# Patient Record
Sex: Female | Born: 1960 | Race: White | Hispanic: No | Marital: Married | Smoking: Current every day smoker
Health system: Southern US, Community
[De-identification: ages and names within clinical notes are randomized; demographics above are authoritative.]

## PROBLEM LIST (undated history)

## (undated) DIAGNOSIS — J45909 Unspecified asthma, uncomplicated: Secondary | ICD-10-CM

## (undated) DIAGNOSIS — E039 Hypothyroidism, unspecified: Secondary | ICD-10-CM

## (undated) DIAGNOSIS — C801 Malignant (primary) neoplasm, unspecified: Secondary | ICD-10-CM

## (undated) DIAGNOSIS — I10 Essential (primary) hypertension: Secondary | ICD-10-CM

---

## 1998-01-02 ENCOUNTER — Other Ambulatory Visit: Admission: RE | Admit: 1998-01-02 | Discharge: 1998-01-02 | Payer: Self-pay | Admitting: Unknown Physician Specialty

## 1998-09-13 ENCOUNTER — Ambulatory Visit: Admission: RE | Admit: 1998-09-13 | Discharge: 1998-09-13 | Payer: Self-pay | Admitting: Otolaryngology

## 1998-11-08 ENCOUNTER — Other Ambulatory Visit: Admission: RE | Admit: 1998-11-08 | Discharge: 1998-11-08 | Payer: Self-pay | Admitting: Otolaryngology

## 1998-11-08 ENCOUNTER — Encounter (INDEPENDENT_AMBULATORY_CARE_PROVIDER_SITE_OTHER): Payer: Self-pay | Admitting: Specialist

## 2001-01-03 ENCOUNTER — Encounter: Payer: Self-pay | Admitting: Emergency Medicine

## 2001-01-03 ENCOUNTER — Inpatient Hospital Stay (HOSPITAL_COMMUNITY): Admission: EM | Admit: 2001-01-03 | Discharge: 2001-01-05 | Payer: Self-pay | Admitting: Emergency Medicine

## 2001-06-23 ENCOUNTER — Other Ambulatory Visit: Admission: RE | Admit: 2001-06-23 | Discharge: 2001-06-23 | Payer: Self-pay | Admitting: Obstetrics and Gynecology

## 2002-05-23 ENCOUNTER — Ambulatory Visit: Admission: RE | Admit: 2002-05-23 | Discharge: 2002-05-23 | Payer: Self-pay | Admitting: Gynecologic Oncology

## 2002-05-23 ENCOUNTER — Other Ambulatory Visit: Admission: RE | Admit: 2002-05-23 | Discharge: 2002-05-23 | Payer: Self-pay | Admitting: Gynecologic Oncology

## 2002-05-23 ENCOUNTER — Encounter (INDEPENDENT_AMBULATORY_CARE_PROVIDER_SITE_OTHER): Payer: Self-pay | Admitting: *Deleted

## 2002-05-30 ENCOUNTER — Encounter: Payer: Self-pay | Admitting: Gynecology

## 2002-05-30 ENCOUNTER — Ambulatory Visit (HOSPITAL_COMMUNITY): Admission: RE | Admit: 2002-05-30 | Discharge: 2002-05-30 | Payer: Self-pay | Admitting: Gynecology

## 2002-06-21 ENCOUNTER — Ambulatory Visit: Admission: RE | Admit: 2002-06-21 | Discharge: 2002-06-21 | Payer: Self-pay | Admitting: Gynecologic Oncology

## 2003-07-02 ENCOUNTER — Other Ambulatory Visit: Admission: RE | Admit: 2003-07-02 | Discharge: 2003-07-02 | Payer: Self-pay | Admitting: Obstetrics and Gynecology

## 2004-03-26 ENCOUNTER — Encounter (INDEPENDENT_AMBULATORY_CARE_PROVIDER_SITE_OTHER): Payer: Self-pay | Admitting: *Deleted

## 2004-03-26 ENCOUNTER — Ambulatory Visit (HOSPITAL_COMMUNITY): Admission: RE | Admit: 2004-03-26 | Discharge: 2004-03-27 | Payer: Self-pay | Admitting: Otolaryngology

## 2004-04-25 ENCOUNTER — Inpatient Hospital Stay (HOSPITAL_COMMUNITY): Admission: RE | Admit: 2004-04-25 | Discharge: 2004-04-28 | Payer: Self-pay | Admitting: Otolaryngology

## 2004-04-25 ENCOUNTER — Encounter: Payer: Self-pay | Admitting: Endocrinology

## 2004-04-25 ENCOUNTER — Encounter (INDEPENDENT_AMBULATORY_CARE_PROVIDER_SITE_OTHER): Payer: Self-pay | Admitting: Specialist

## 2004-06-10 ENCOUNTER — Encounter (HOSPITAL_COMMUNITY): Admission: RE | Admit: 2004-06-10 | Discharge: 2004-09-08 | Payer: Self-pay | Admitting: Endocrinology

## 2004-08-08 ENCOUNTER — Other Ambulatory Visit: Admission: RE | Admit: 2004-08-08 | Discharge: 2004-08-08 | Payer: Self-pay | Admitting: Obstetrics and Gynecology

## 2005-04-13 ENCOUNTER — Encounter (HOSPITAL_COMMUNITY): Admission: RE | Admit: 2005-04-13 | Discharge: 2005-07-12 | Payer: Self-pay | Admitting: Endocrinology

## 2005-09-04 ENCOUNTER — Other Ambulatory Visit: Admission: RE | Admit: 2005-09-04 | Discharge: 2005-09-04 | Payer: Self-pay | Admitting: Obstetrics and Gynecology

## 2005-09-21 ENCOUNTER — Ambulatory Visit (HOSPITAL_COMMUNITY): Admission: RE | Admit: 2005-09-21 | Discharge: 2005-09-21 | Payer: Self-pay | Admitting: Obstetrics and Gynecology

## 2006-09-05 IMAGING — CR DG CHEST 2V
2 series · 2 of 2 positions shown · non-contrast
Comparison: none

CLINICAL DATA: Preoperative respiratory exam for left neck node. 
 CHEST ? TWO VIEW:

  The heart size and mediastinal contours are normal. The lungs are clear. The visualized skeleton is unremarkable.

[view not recorded (1 of 2)]
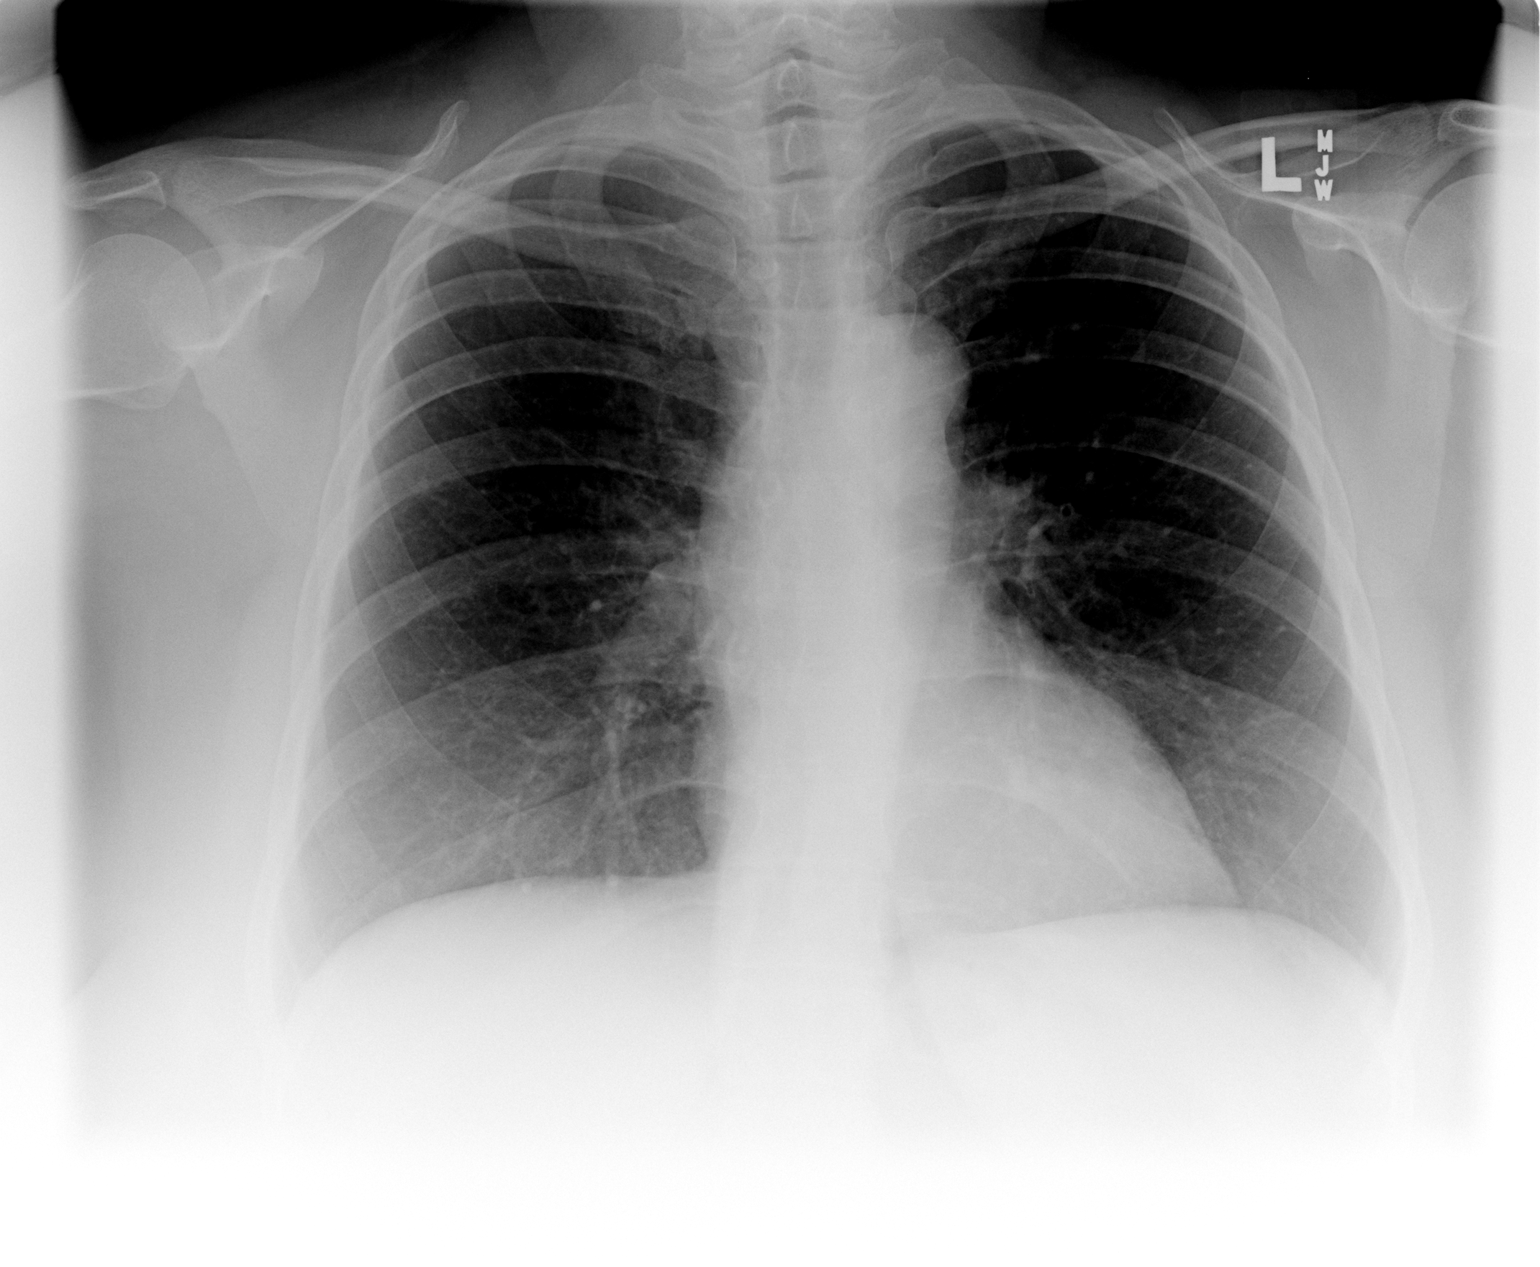

[view not recorded (2 of 2)]
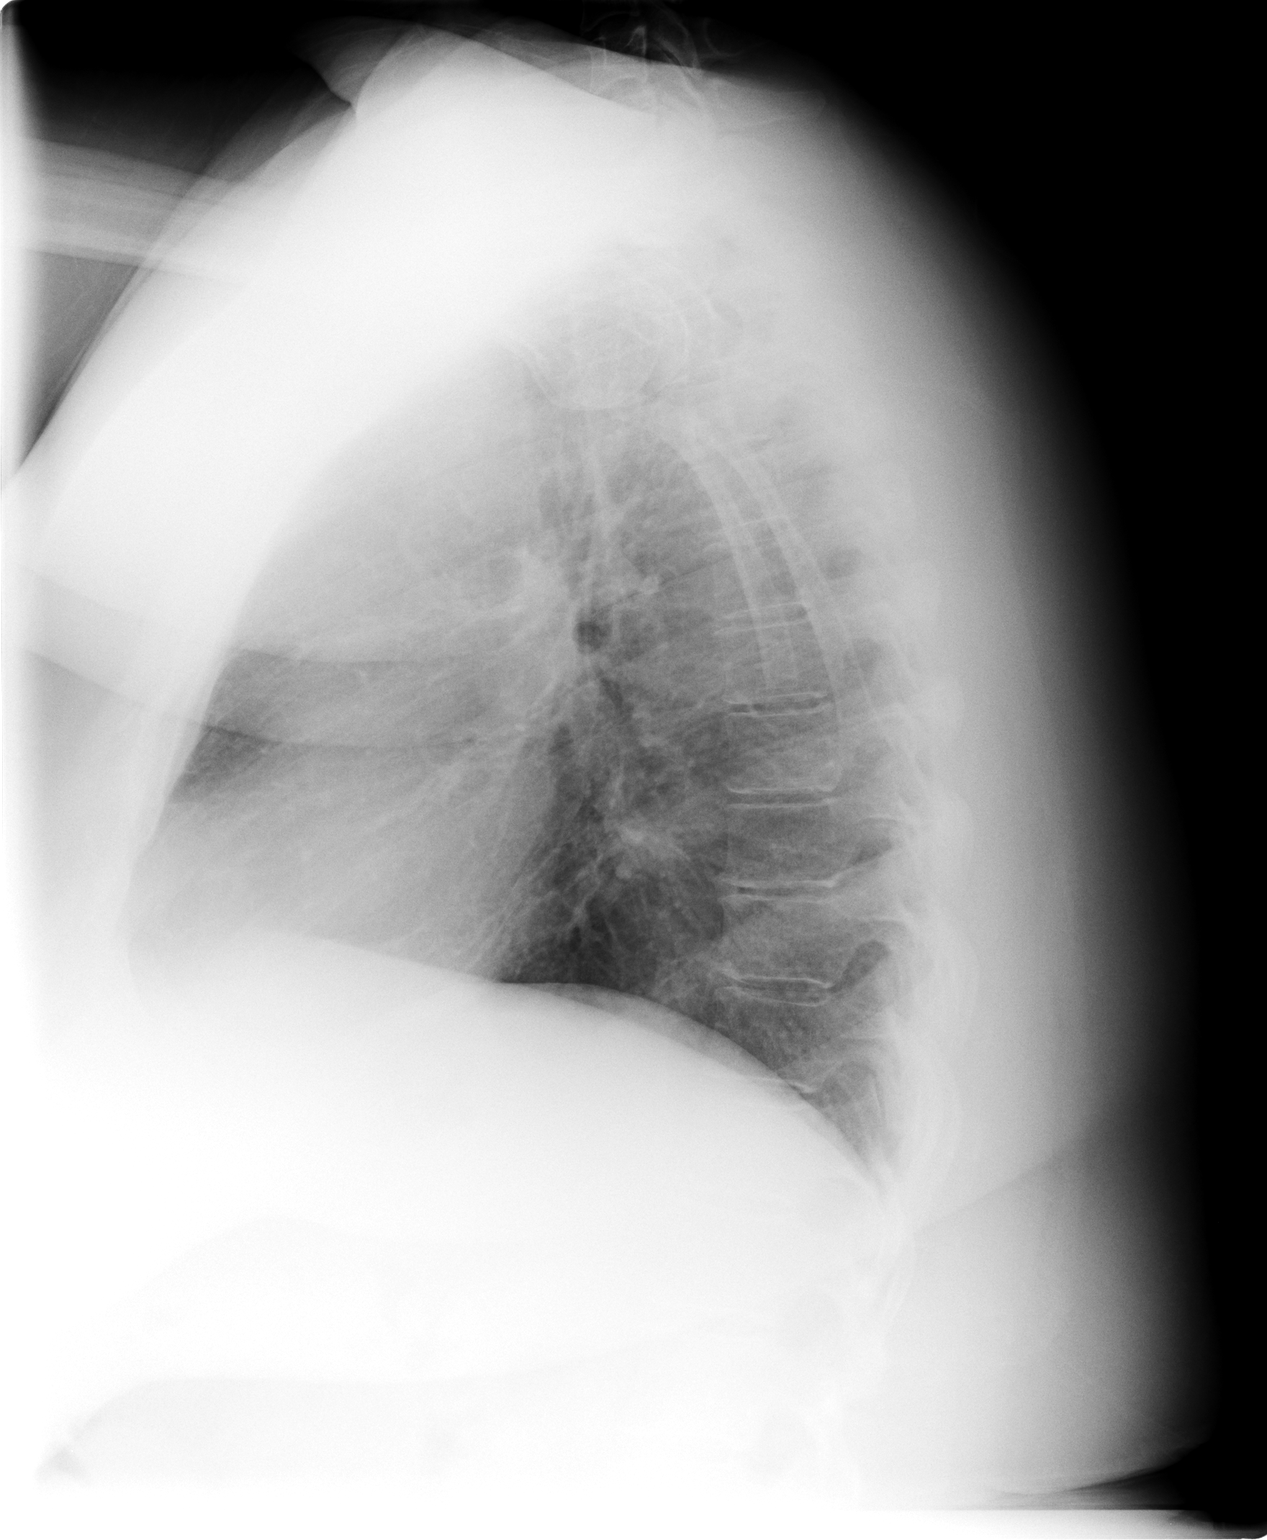

[2 of 2 positions shown; findings below may reference images not displayed]

IMPRESSION: No active disease.

## 2007-02-03 ENCOUNTER — Ambulatory Visit (HOSPITAL_COMMUNITY): Admission: RE | Admit: 2007-02-03 | Discharge: 2007-02-03 | Payer: Self-pay | Admitting: Family Medicine

## 2007-07-29 ENCOUNTER — Encounter: Admission: RE | Admit: 2007-07-29 | Discharge: 2007-07-29 | Payer: Self-pay | Admitting: Family Medicine

## 2008-08-09 HISTORY — PX: TOTAL THYROIDECTOMY: SHX2547

## 2009-02-21 ENCOUNTER — Encounter: Admission: RE | Admit: 2009-02-21 | Discharge: 2009-02-21 | Payer: Self-pay | Admitting: Family Medicine

## 2009-02-27 ENCOUNTER — Ambulatory Visit: Payer: Self-pay | Admitting: Endocrinology

## 2009-02-27 DIAGNOSIS — E890 Postprocedural hypothyroidism: Secondary | ICD-10-CM

## 2009-02-27 DIAGNOSIS — C73 Malignant neoplasm of thyroid gland: Secondary | ICD-10-CM | POA: Insufficient documentation

## 2009-02-27 DIAGNOSIS — R599 Enlarged lymph nodes, unspecified: Secondary | ICD-10-CM | POA: Insufficient documentation

## 2009-03-06 ENCOUNTER — Ambulatory Visit: Payer: Self-pay | Admitting: Internal Medicine

## 2009-03-06 DIAGNOSIS — J329 Chronic sinusitis, unspecified: Secondary | ICD-10-CM | POA: Insufficient documentation

## 2009-03-06 DIAGNOSIS — R930 Abnormal findings on diagnostic imaging of skull and head, not elsewhere classified: Secondary | ICD-10-CM | POA: Insufficient documentation

## 2009-03-06 DIAGNOSIS — J45909 Unspecified asthma, uncomplicated: Secondary | ICD-10-CM | POA: Insufficient documentation

## 2009-03-06 DIAGNOSIS — I10 Essential (primary) hypertension: Secondary | ICD-10-CM | POA: Insufficient documentation

## 2009-03-12 ENCOUNTER — Ambulatory Visit (HOSPITAL_COMMUNITY): Admission: RE | Admit: 2009-03-12 | Discharge: 2009-03-13 | Payer: Self-pay | Admitting: Otolaryngology

## 2009-03-12 ENCOUNTER — Encounter (INDEPENDENT_AMBULATORY_CARE_PROVIDER_SITE_OTHER): Payer: Self-pay | Admitting: Otolaryngology

## 2009-03-19 ENCOUNTER — Ambulatory Visit: Payer: Self-pay | Admitting: Endocrinology

## 2009-05-01 ENCOUNTER — Ambulatory Visit: Payer: Self-pay | Admitting: Endocrinology

## 2009-05-03 ENCOUNTER — Telehealth: Payer: Self-pay | Admitting: Endocrinology

## 2009-05-06 ENCOUNTER — Telehealth: Payer: Self-pay | Admitting: Endocrinology

## 2009-05-08 ENCOUNTER — Telehealth (INDEPENDENT_AMBULATORY_CARE_PROVIDER_SITE_OTHER): Payer: Self-pay | Admitting: *Deleted

## 2009-05-09 ENCOUNTER — Ambulatory Visit: Payer: Self-pay | Admitting: Endocrinology

## 2009-05-09 LAB — CONVERTED CEMR LAB: TSH: 32.73 microintl units/mL — ABNORMAL HIGH (ref 0.35–5.50)

## 2009-05-23 ENCOUNTER — Telehealth: Payer: Self-pay | Admitting: Endocrinology

## 2009-05-23 ENCOUNTER — Ambulatory Visit: Payer: Self-pay | Admitting: Endocrinology

## 2009-05-24 ENCOUNTER — Encounter (HOSPITAL_COMMUNITY): Admission: RE | Admit: 2009-05-24 | Discharge: 2009-08-06 | Payer: Self-pay | Admitting: Endocrinology

## 2009-06-03 ENCOUNTER — Encounter (HOSPITAL_COMMUNITY): Admission: RE | Admit: 2009-06-03 | Discharge: 2009-08-06 | Payer: Self-pay | Admitting: Endocrinology

## 2009-06-10 ENCOUNTER — Ambulatory Visit: Payer: Self-pay | Admitting: Internal Medicine

## 2009-06-11 ENCOUNTER — Telehealth: Payer: Self-pay | Admitting: Endocrinology

## 2009-06-18 ENCOUNTER — Ambulatory Visit: Payer: Self-pay | Admitting: Endocrinology

## 2009-06-18 LAB — CONVERTED CEMR LAB: TSH: 0.21 microintl units/mL — ABNORMAL LOW (ref 0.35–5.50)

## 2009-06-25 ENCOUNTER — Ambulatory Visit: Payer: Self-pay | Admitting: Endocrinology

## 2009-07-25 ENCOUNTER — Ambulatory Visit: Payer: Self-pay | Admitting: Endocrinology

## 2009-07-31 ENCOUNTER — Telehealth: Payer: Self-pay | Admitting: Endocrinology

## 2009-09-19 ENCOUNTER — Telehealth: Payer: Self-pay | Admitting: Internal Medicine

## 2009-09-25 ENCOUNTER — Ambulatory Visit: Payer: Self-pay | Admitting: Endocrinology

## 2009-09-25 DIAGNOSIS — E209 Hypoparathyroidism, unspecified: Secondary | ICD-10-CM

## 2009-09-25 DIAGNOSIS — E069 Thyroiditis, unspecified: Secondary | ICD-10-CM

## 2009-09-25 LAB — CONVERTED CEMR LAB: Magnesium: 2.2 mg/dL (ref 1.5–2.5)

## 2009-09-26 ENCOUNTER — Encounter: Payer: Self-pay | Admitting: Endocrinology

## 2009-10-10 ENCOUNTER — Ambulatory Visit: Payer: Self-pay | Admitting: Endocrinology

## 2009-10-11 LAB — CONVERTED CEMR LAB: Calcium, Total (PTH): 8.8 mg/dL (ref 8.4–10.5)

## 2009-10-14 ENCOUNTER — Telehealth: Payer: Self-pay | Admitting: Endocrinology

## 2009-10-29 ENCOUNTER — Encounter (HOSPITAL_COMMUNITY): Admission: RE | Admit: 2009-10-29 | Discharge: 2010-01-02 | Payer: Self-pay | Admitting: Endocrinology

## 2009-11-01 ENCOUNTER — Telehealth: Payer: Self-pay | Admitting: Internal Medicine

## 2009-11-05 ENCOUNTER — Ambulatory Visit: Payer: Self-pay | Admitting: Endocrinology

## 2010-02-04 ENCOUNTER — Telehealth (INDEPENDENT_AMBULATORY_CARE_PROVIDER_SITE_OTHER): Payer: Self-pay | Admitting: *Deleted

## 2010-04-27 ENCOUNTER — Encounter: Payer: Self-pay | Admitting: Endocrinology

## 2010-05-08 NOTE — Miscellaneous (Signed)
Summary: Health visitor   Imported By: Lester Harrison 07/05/2009 08:38:11  _____________________________________________________________________  External Attachment:    Type:   Image     Comment:   External Document

## 2010-05-08 NOTE — Progress Notes (Signed)
Summary: labs  Phone Note Call from Patient Call back at Home Phone [phone]   Caller: Patient Call For: Newt Lukes MD Summary of Call: Pt called and set up f/u appt w/Dr Everardo All. Pt is requesting lab to check thyroid level prior to appointment? Please let pt know if she can go to lab before appt. Initial call taken by: Verdell Face,  June 11, 2009 8:49 AM  Follow-up for Phone Call        tsh 244.0 Follow-up by: Minus Breeding MD,  June 11, 2009 10:05 AM  Additional Follow-up for Phone Call Additional follow up Details #1::        labs sch, pt informed Additional Follow-up by: Margaret Pyle, CMA,  June 11, 2009 10:39 AM

## 2010-05-08 NOTE — Assessment & Plan Note (Signed)
Summary: INSOMNIA,ANXIETY, HAS BEEN OFF SYNTHROID FOR 4 WEEKS DUE TO H...   Vital Signs:  Patient profile:   50 year old female Height:      64 inches (162.56 cm) Weight:      304.50 pounds (138.41 kg) O2 Sat:      94 % on Room air Temp:     97.3 degrees F (36.28 degrees C) oral Pulse rate:   92 / minute BP sitting:   140 / 90  (left arm) Cuff size:   large  Vitals Entered By: Josph Macho RMA (May 23, 2009 1:50 PM)  O2 Flow:  Room air CC: Insomnia, Anxiety, pt has been off of Synthroid for 4 weeks due to starting radiation tomorrow (05/24/09)/ CF Is Patient Diabetic? No   Referring Provider:  [facility] Primary Provider:  [name] ([facility])  CC:  Insomnia, Anxiety, and pt has been off of Synthroid for 4 weeks due to starting radiation tomorrow (05/24/09)/ CF.  History of Present Illness: see above.  she ius scheduled for i-131 rx tomorrow.   she also reports hoarseness, fatigue, and muscle cramps.    Current Medications (verified): 1)  Levothyroxine Sodium 200 Mcg Tabs (Levothyroxine Sodium) .Marland Kitchen.. 1 Qd 2)  Lisinopril 40 Mg Tabs (Lisinopril) .... Take 1 By Mouth Qd 3)  Amlodipine Besylate 5 Mg Tabs (Amlodipine Besylate) .... Take 1 By Mouth Qd 4)  Furosemide 20 Mg Tabs (Furosemide) .... Take 2 By Mouth Qd 5)  Klor-Con 10 10 Meq Cr-Tabs (Potassium Chloride) .... Take 1 By Mouth Qd 6)  Proventil Hfa 108 (90 Base) Mcg/act Aers (Albuterol Sulfate) .... 2 Puffs Inhaled Every 4 Hours As Needed 7)  Qvar 80 Mcg/act Aers (Beclomethasone Dipropionate) .Marland Kitchen.. 1 Puff Two Times A Day  Allergies (verified): 1)  ! Codeine  Past History:  Past Medical History: Last updated: 03/06/2009 ADENOCARCINOMA, THYROID GLAND, PAPILLARY POSTSURGICAL HYPOTHYROIDISM  Hypertension sinusitus, chroinc asthma obesity  Review of Systems       The patient complains of weight gain.    Physical Exam  General:  morbidly obese.  no distress  Neck:  a healed scar  is present.  i do not appreciate a nodule in the thyroid or elsewhere in the neck Neurologic:  voice is hoarse. Psych:  depressed affect.     Impression & Recommendations:  Problem # 1:  POSTSURGICAL HYPOTHYROIDISM (ICD-244.0) she is tolerating poorly  Medications Added to Medication List This Visit: 1)  Zolpidem Tartrate 5 Mg Tabs (Zolpidem tartrate) .Marland Kitchen.. 1 at bedtime as needed sleep  Other Orders: Est. Patient Level III ([address])  Patient Instructions: 1)  please have the i-131 therapy tomorrow as scheduled.   2)  2 days later, restart the levothyroxine. 3)  return in approx 1 month. 4)  ambien 5 mg at night as needed sleep. Prescriptions: ZOLPIDEM TARTRATE 5 MG TABS (ZOLPIDEM TARTRATE) 1 at bedtime as needed sleep  #30 x 0   Entered and Authorized by:   Minus Breeding MD   Signed by:   Minus Breeding MD on 05/23/2009   Method used:   Print then Give to Patient   RxID:   [id number]

## 2010-05-08 NOTE — Assessment & Plan Note (Signed)
Summary: F.U APPT PER PT/CD   Vital Signs:  Patient profile:   50 year old female Height:      64 inches (162.56 cm) Weight:      300.25 pounds (136.48 kg) O2 Sat:      93 % on Room air Temp:     98.2 degrees F (36.78 degrees C) oral Pulse rate:   106 / minute BP sitting:   122 / 88  (left arm) Cuff size:   large  Vitals Entered By: Josph Macho RMA (June 25, 2009 8:25 AM)  O2 Flow:  Room air CC: Follow-up visit/ CF Is Patient Diabetic? No   CC:  Follow-up visit/ CF.  History of Present Illness: the status of 3 ongoing medical problems is addressed today: surgical hypothyroidism:  at the time of the recent tsh, she had been back on synthroid x 3 weeks.  she sometimes takes extra pills, though.  fatigue is improved. hypopcalcemia was noted in the hospital:  she has a few muscle cramps. thyroid carcinoma:  she does not notice any lump at the head or neck.   Current Medications (verified): 1)  Levothyroxine Sodium 200 Mcg Tabs (Levothyroxine Sodium) .Marland Kitchen.. 1 Qd 2)  Furosemide 20 Mg Tabs (Furosemide) .... Take 2 By Mouth Qd 3)  Klor-Con 10 10 Meq Cr-Tabs (Potassium Chloride) .... Take 1 By Mouth Qd 4)  Proventil Hfa 108 (90 Base) Mcg/act Aers (Albuterol Sulfate) .... 2 Puffs Inhaled Every 4 Hours As Needed 5)  Qvar 80 Mcg/act Aers (Beclomethasone Dipropionate) .Marland Kitchen.. 1 Puff Two Times A Day 6)  Zolpidem Tartrate 5 Mg Tabs (Zolpidem Tartrate) .Marland Kitchen.. 1 At Bedtime As Needed Sleep 7)  Albuterol Sulfate (2.5 Mg/85ml) 0.083% Nebu (Albuterol Sulfate) .Marland Kitchen.. 1 Vial in Nebulizer Once Daily As Needed 8)  Benicar 40 Mg  Tabs (Olmesartan Medoxomil) .... One Tablet By Mouth Daily  Allergies (verified): 1)  ! Codeine  Past History:  Past Medical History: ADENOCARCINOMA, THYROID GLAND, PAPILLARY, stage 3    - original surgery 2005     -march, 2006:  i-131 rx, 153 [facility]    - Dec 2010, had left neck dissection for recurrence    -s/p  I 131 Feb 18th 2011 POSTSURGICAL HYPOTHYROIDISM    Hypertension    - Ace D/c'd 06/10/2009 sinusitus, chroinc asthma obesity  Review of Systems  The patient denies dyspnea on exertion.         she has re-lost some of the weight she gained with her hypothyroidism.  Physical Exam  General:  normal appearance.   Neck:  a healed scar is present.  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 Patient Level IV ([address])  Patient Instructions: 1)  plan will be to recheck thyroid nuclear body scan in the summer. 2)  take only 200 micrograms/day of levothyroxine. 3)  go to lab in approx 1 month for tsh 244.0, and parathyroid 275.41 4)  return 3 months

## 2010-05-08 NOTE — Assessment & Plan Note (Signed)
Summary: PER PT 3 MO FU-OYU   Vital Signs:  Patient profile:   50 year old female Height:      64 inches (162.56 cm) Weight:      301 pounds (136.82 kg) BMI:     51.85 O2 Sat:      96 % on Room air Temp:     97.9 degrees F (36.61 degrees C) oral Pulse rate:   86 / minute Pulse rhythm:   regular BP sitting:   124 / 72  (left arm) Cuff size:   large  Vitals Entered By: Brenton Grills MA (September 25, 2009 8:01 AM)  O2 Flow:  Room air CC: 3 mo f/u-endo/pt states she is no longer taking klor-con or proventil inhalher/aj   Referring Provider:  [facility] Primary Provider:  [name] ([facility])  CC:  3 mo f/u-endo/pt states she is no longer taking klor-con or proventil inhalher/aj.  History of Present Illness: the status of at least 3 ongoing medical problems is addressed today: thyroid cancer:  pt says she does not notice any lump at the neck. hypoparathyroidism:  she denies cramps. hypothyroidism:  no anxiety or depression.    Current Medications (verified): 1)  Furosemide 20 Mg Tabs (Furosemide) .... Take 2 By Mouth Qd 2)  Klor-Con 10 10 Meq Cr-Tabs (Potassium Chloride) .... Take 1 By Mouth Qd 3)  Proventil Hfa 108 (90 Base) Mcg/act Aers (Albuterol Sulfate) .... 2 Puffs Inhaled Every 4 Hours As Needed 4)  Qvar 80 Mcg/act Aers (Beclomethasone Dipropionate) .Marland Kitchen.. 1 Puff Two Times A Day 5)  Zolpidem Tartrate 5 Mg Tabs (Zolpidem Tartrate) .Marland Kitchen.. 1 At Bedtime As Needed Sleep 6)  Albuterol Sulfate (2.5 Mg/50ml) 0.083% Nebu (Albuterol Sulfate) .Marland Kitchen.. 1 Vial in Nebulizer Once Daily As Needed 7)  Benicar 40 Mg  Tabs (Olmesartan Medoxomil) .... One Tablet By Mouth Daily 8)  Levothyroxine Sodium 112 Mcg Tabs (Levothyroxine Sodium) .... 2 Tabs Once Daily 9)  Calcitriol 0.25 Mcg Caps (Calcitriol) .Marland Kitchen.. 1 Once Daily  Allergies (verified): 1)  ! Codeine  Past History:  Past Medical History: Last updated: 06/25/2009 ADENOCARCINOMA, THYROID GLAND, PAPILLARY, stage 3    - original surgery 2005     -march, 2006:  i-131 rx, 153 mci    - Dec 2010, had left neck dissection for recurrence    -s/p  I 131 Feb 18th 2011 POSTSURGICAL HYPOTHYROIDISM  Hypertension    - Ace D/c'd 06/10/2009 sinusitus, chroinc asthma obesity  Review of Systems  The patient denies weight loss and weight gain.    Physical Exam  General:  normal appearance.   Neck:  a healed scar is present.  i do not appreciate a nodule in the thyroid or elsewhere in the neck  Additional Exam:  Thyroglobulin Antibody Thyroglobulin             <0.2 ng/mL     Parathyroid Hormone  [L]  11.1 pg/mL                  14.0-72.0 Calcium              [L]  7.6 mg/dL      FastTSH                   0.57 uIU/mL                 0.35-5.50 Magnesium                 2.2  mg/dL        Impression & Recommendations:  Problem # 1:  THYROIDITIS (ICD-245.9) this prevents interpretation of thyroglobulin, although it often improves with time after thyroidectomy.  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 1 once daily  Other Orders: T-Thyroglobulin Panel (82956, [phone]) T-Parathyroid Hormone, Intact w/ Calcium (69629-52841) TLB-TSH (Thyroid Stimulating Hormone) (84443-TSH) TLB-Magnesium (Mg) (83735-MG) Est. Patient Level IV (32440)  Patient Instructions: 1)  blood tests are being ordered for you today.  please call [phone] to hear your test results. 2)  stop synthroid. 3)  in 2 weeks, go to lab for tsh 244.0, and parathyroid hormone 252.1 4)  (update: i left message on phone-tree:  when you resume your synthroid, the dosage will be 125/d.  increase rocaltrol to 0.5 mcg/d). Prescriptions: CALCITRIOL 0.5 MCG CAPS  (CALCITRIOL) 1 once daily  #30 x 11   Entered and Authorized by:   Minus Breeding MD   Signed by:   Minus Breeding MD on 09/26/2009   Method used:   [facility]  [place] [address] [facility]       [address]       [place], [place]  [address]       Ph: [phone] or [phone]       Fax: [phone]   RxID:   [id number]

## 2010-05-08 NOTE — Progress Notes (Signed)
Summary: TSH for i-131  Phone Note Outgoing Call   Call placed by: Dagoberto Reef,  May 08, 2009 8:45 AM Summary of Call: Dr Everardo All, need a TSH on pt per [facility] nuc med, tsh need to be at least 30 or above.  Thanks Initial call taken by: Dagoberto Reef,  May 08, 2009 8:47 AM  Follow-up for Phone Call        please call patient: they will not schedule until blood test has been done.  please come in next week for tsh 244.0 and thyroglobulin panel 193 Follow-up by: Minus Breeding MD,  May 08, 2009 1:31 PM  Additional Follow-up for Phone Call Additional follow up Details #1::        labs scheduled for 05/13/2009, pt informed Additional Follow-up by: Margaret Pyle, CMA,  May 08, 2009 2:33 PM

## 2010-05-08 NOTE — Progress Notes (Signed)
Summary: Fatigued  Phone Note Call from Patient Call back at [facility] Phone [phone]   Caller: Patient Summary of Call: pt called stating that she is still extremely fatigued and would like to know if there is an OTC supplement she can take (Vit B6) to help. please advise Initial call taken by: Margaret Pyle, CMA,  May 06, 2009 1:51 PM  Follow-up for Phone Call        sorry there is not a medication that will help you. Follow-up by: Minus Breeding MD,  May 06, 2009 2:02 PM  Additional Follow-up for Phone Call Additional follow up Details #1::        pt informed Additional Follow-up by: Margaret Pyle, CMA,  May 06, 2009 2:39 PM

## 2010-05-08 NOTE — Progress Notes (Signed)
Summary: Rx refill req/SAE pt  Phone Note Call from Patient Call back at Home Phone [phone]   Caller: Patient Summary of Call: Pt called stating that she is having symptoms of her Hypoparathyroidism and is feeling very fatigued and unfocused. Pt had I-131 today and was advised by SAE that she can restart Thyroid medications after scan. Pt wanted to start Armor Thyroid and has appt with SAE 08/03 to discuss this as well as results of scan but due to symptoms she is requesting a 5 day refill of Liothyronine Sodium until appt. Please advise. Initial call taken by: Margaret Pyle, CMA,  November 01, 2009 11:07 AM  Follow-up for Phone Call        ok - refill done x 5days Follow-up by: Newt Lukes MD,  November 01, 2009 12:00 PM  Additional Follow-up for Phone Call Additional follow up Details #1::        pt informed Additional Follow-up by: Brenton Grills MA,  November 01, 2009 2:07 PM    Prescriptions: LIOTHYRONINE SODIUM 25 MCG TABS (LIOTHYRONINE SODIUM) 1/2 tab two times a day  #5 x 0   Entered and Authorized by:   Newt Lukes MD   Signed by:   Newt Lukes MD on 11/01/2009   Method used:   [facility]  [place] [address] [facility]       [address]       [place], [place]  [address]       Ph: [phone] or [phone]       Fax: [phone]   RxID:   [id number]

## 2010-05-08 NOTE — Progress Notes (Signed)
  Phone Note Other Incoming   Request: Send information Summary of Call: Request for records received from [facility]. Request forwarded to [employer] for records from 04/2008 to present.

## 2010-05-08 NOTE — Progress Notes (Signed)
Summary: I-131?  Phone Note Call from Patient Call back at Home Phone [phone]   Caller: Patient Summary of Call: pt called stating that she is scheduled to have 1-131 tomorrow and has been off Synthroid x 4 weeks. pt says she is unable to sleep and has increased anxiety. pt is requesting Rx for both until she is able to re-start her synthroid. pt also would like to know when MD would like to f/u with her after therapy. please advise Initial call taken by: Margaret Pyle, CMA,  May 23, 2009 10:13 AM  Follow-up for Phone Call        ov today please Follow-up by: Minus Breeding MD,  May 23, 2009 10:23 AM  Additional Follow-up for Phone Call Additional follow up Details #1::        pt informed and scheduled today Additional Follow-up by: Margaret Pyle, CMA,  May 23, 2009 10:35 AM

## 2010-05-08 NOTE — Progress Notes (Signed)
Summary: Pls Call  Phone Note Call from Patient Call back at Home Phone [phone]   Caller: Patient Summary of Call: Pt called stating that she was scheduled to have thyroid scan done. Pt was told that she would need to take a pill than return in two days for scan. Pt is very confused and concerned and is requesting call from MD. Pt says this was not explained on PT message. Initial call taken by: Margaret Pyle, CMA,  October 14, 2009 11:28 AM  Follow-up for Phone Call        i called pt 10/14/09.  take cytomel 1/2 of 25 micrograms two times a day, x 1 week.  then no thyroid med until after scan. Follow-up by: Minus Breeding MD,  October 14, 2009 1:00 PM    New/Updated Medications: LIOTHYRONINE SODIUM 25 MCG TABS (LIOTHYRONINE SODIUM) 1/2 tab two times a day Prescriptions: LIOTHYRONINE SODIUM 25 MCG TABS (LIOTHYRONINE SODIUM) 1/2 tab two times a day  #10 x 0   Entered and Authorized by:   Minus Breeding MD   Signed by:   Minus Breeding MD on 10/14/2009   Method used:   [facility]  [place] [address]       [address]       [place], [place]  [address]       Ph: [phone] or [phone]       Fax: [phone]   RxID:   [id number]

## 2010-05-08 NOTE — Assessment & Plan Note (Signed)
Summary: FU--STC   Vital Signs:  Patient profile:   50 year old female Height:      64 inches (162.56 cm) Weight:      297 pounds (135 kg) O2 Sat:      94 % on Room air Temp:     97.6 degrees F (36.44 degrees C) oral Pulse rate:   87 / minute BP sitting:   140 / 88  (left arm) Cuff size:   large  Vitals Entered By: Josph Macho CMA (May 01, 2009 10:57 AM)  O2 Flow:  Room air CC: Follow-up visit/ Discuss Radiation pill?/ CF   Referring Provider:  [facility] Primary Provider:  [name] ([facility])  CC:  Follow-up visit/ Discuss Radiation pill?/ CF.  History of Present Illness: pt has been off synthroid x 3 days.   pt states 1 week of slight itching at the surgical area of her left anterior neck, and slight associated redness.  Current Medications (verified): 1)  Levothyroxine Sodium 200 Mcg Tabs (Levothyroxine Sodium) .Marland Kitchen.. 1 Qd 2)  Lisinopril 40 Mg Tabs (Lisinopril) .... Take 1 By Mouth Qd 3)  Amlodipine Besylate 5 Mg Tabs (Amlodipine Besylate) .... Take 1 By Mouth Qd 4)  Furosemide 20 Mg Tabs (Furosemide) .... Take 2 By Mouth Qd 5)  Klor-Con 10 10 Meq Cr-Tabs (Potassium Chloride) .... Take 1 By Mouth Qd 6)  Proventil Hfa 108 (90 Base) Mcg/act Aers (Albuterol Sulfate) .... 2 Puffs Inhaled Every 4 Hours As Needed 7)  Qvar 80 Mcg/act Aers (Beclomethasone Dipropionate) .Marland Kitchen.. 1 Puff Two Times A Day  Allergies (verified): 1)  ! Codeine  Past History:  Past Medical History: Last updated: 03/06/2009 ADENOCARCINOMA, THYROID GLAND, PAPILLARY POSTSURGICAL HYPOTHYROIDISM  Hypertension sinusitus, chroinc asthma obesity  Review of Systems  The patient denies weight loss and weight gain.    Physical Exam  General:  obese.  no distress  Neck:  at the surgical site at the left anterior neck, there is slight erythema but no drainage or swelling   Impression & Recommendations:  Problem # 1:  ADENOCARCINOMA, THYROID GLAND, PAPILLARY  (ICD-193) prob stage 4  Problem # 2:  POSTSURGICAL HYPOTHYROIDISM (ICD-244.0)  Problem # 3:  rash at her surgical site, no evidence of infection  Other Orders: Radiology Referral (Radiology) Est. Patient Level IV ([address])  Patient Instructions: 1)  stay-off the levothyroxine until advised to resume. 2)  you will be called about the iodine therapy. please come in for blood tests 2 business days prior to the treatment. 3)  apply hydrocortisone and clotrimazole to the red area on your neck.

## 2010-05-08 NOTE — Progress Notes (Signed)
Summary: pt?  Phone Note Call from Patient Call back at Home Phone [phone]   Caller: Patient Summary of Call: pt called stating that she has been feeling increasingly sluggish since stopping thyriod meds 5 days ago. pt was to know from MD if she can take a smaller dosage of medication until test is done. I advised that this would affect the results but pt would like MD's opinion. please advise Initial call taken by: Margaret Pyle, CMA,  May 03, 2009 8:55 AM  Follow-up for Phone Call        please make every effort to stay-off the medication, as doing so is necessary for the test Follow-up by: Minus Breeding MD,  May 03, 2009 12:52 PM  Additional Follow-up for Phone Call Additional follow up Details #1::        pt informed Additional Follow-up by: Margaret Pyle, CMA,  May 03, 2009 1:09 PM

## 2010-05-08 NOTE — Progress Notes (Signed)
Summary: Rx req/SAE pt  Phone Note Refill Request Message from:  [facility]  Refills Requested: Medication #1:  ZOLPIDEM TARTRATE 5 MG TABS 1 at bedtime as needed sleep   Dosage confirmed as above?Dosage Confirmed   Supply Requested: 1 month   Last Refilled: 05/25/2009 Initial call taken by: Margaret Pyle, CMA,  September 19, 2009 8:23 AM    Prescriptions: ZOLPIDEM TARTRATE 5 MG TABS (ZOLPIDEM TARTRATE) 1 at bedtime as needed sleep  #30 x 1   Entered and Authorized by:   Corwin Levins MD   Signed by:   Corwin Levins MD on 09/19/2009   Method used:   Print then Give to Patient   RxID:   [id number]  done hardcopy to [facility] side B - dahlia  Corwin Levins MD  September 19, 2009 9:20 AM   Rx faxed to pharmacy Margaret Pyle, CMA  September 19, 2009 9:50 AM

## 2010-05-08 NOTE — Progress Notes (Signed)
Summary: Rx refill  Phone Note Call from Patient Call back at Home Phone [phone]   Caller: Patient Summary of Call: pt called requesting refills of Qvar and Benicar. Initial call taken by: Margaret Pyle, CMA,  July 31, 2009 3:37 PM    Prescriptions: BENICAR 40 MG  TABS (OLMESARTAN MEDOXOMIL) One tablet by mouth daily  #30 x 5   Entered by:   Margaret Pyle, CMA   Authorized by:   Minus Breeding MD   Signed by:   Margaret Pyle, CMA on 07/31/2009   Method used:   [facility]  [place] [address] [facility]       [address]       [place], [place]  [address]       Ph: [phone] or [phone]       Fax: [phone]   RxID:   [id number] QVAR 80 MCG/ACT AERS (BECLOMETHASONE DIPROPIONATE) 1 puff two times a day  #1 x 5   Entered by:   Margaret Pyle, CMA   Authorized by:   Minus Breeding MD   Signed by:   Margaret Pyle, CMA on 07/31/2009   Method used:   [facility]  [place] [address] [facility]       [address]       [place], [place]  [address]       Ph: [phone] or [phone]       Fax: [phone]   RxID:   [id number]

## 2010-05-08 NOTE — Assessment & Plan Note (Signed)
Summary: follow up after thyroid scan-lb   Vital Signs:  Patient profile:   50 year old female Height:      64 inches (162.56 cm) Weight:      316.25 pounds (143.75 kg) BMI:     54.48 O2 Sat:      94 % on Room air Temp:     98.2 degrees F (36.78 degrees C) oral Pulse rate:   92 / minute BP sitting:   132 / 84  (left arm) Cuff size:   large  Vitals Entered By: Brenton Grills MA (November 05, 2009 8:08 AM)  O2 Flow:  Room air CC: F/U after thyroid scan/switch thyroid medications/aj Comments pt is no longer using Proventil   Referring Kathy Doyle:  [facility] Primary Kathy Doyle:  Kathy Doyle ([facility])  CC:  F/U after thyroid scan/switch thyroid medications/aj.  History of Present Illness: pt says she wants to take armour thyroid.  she has fatigue. she has few weeks of a slight rash under the breasts in the context of taking synthroid.  there is associated itching.  Current Medications (verified): 1)  Furosemide 20 Mg Tabs (Furosemide) .... Take 2 By Mouth Qd 2)  Klor-Con 10 10 Meq Cr-Tabs (Potassium Chloride) .... Take 1 By Mouth Qd 3)  Proventil Hfa 108 (90 Base) Mcg/act Aers (Albuterol Sulfate) .... 2 Puffs Inhaled Every 4 Hours As Needed 4)  Qvar 80 Mcg/act Aers (Beclomethasone Dipropionate) .Marland Kitchen.. 1 Puff Two Times A Day 5)  Zolpidem Tartrate 5 Mg Tabs (Zolpidem Tartrate) .Marland Kitchen.. 1 At Bedtime As Needed Sleep 6)  Albuterol Sulfate (2.5 Mg/34ml) 0.083% Nebu (Albuterol Sulfate) .Marland Kitchen.. 1 Vial in Nebulizer Once Daily As Needed 7)  Benicar 40 Mg  Tabs (Olmesartan Medoxomil) .... One Tablet By Mouth Daily 8)  Calcitriol 0.5 Mcg Caps (Calcitriol) .Marland Kitchen.. 1 Once Daily 9)  Liothyronine Sodium 25 Mcg Tabs (Liothyronine Sodium) .... 1/2 Tab Two Times A Day  Allergies (verified): 1)  ! Codeine  Past History:  Past Medical History: ADENOCARCINOMA, THYROID GLAND, PAPILLARY, stage 3    - original surgery 2005     -march, 2006:  i-131 rx, 153 [facility]    - Dec 2010, had left  neck dissection for recurrence    -s/p  I 131 rx  Feb 18th 2011, 206 [facility]   7/11:  body scan neg   6/11:  tg undet (ab pos) POSTSURGICAL HYPOTHYROIDISM  Hypertension    - Ace D/c'd 06/10/2009 sinusitus, chroinc asthma obesity  Review of Systems       she has a few muscle cramps.  Physical Exam  General:  normal appearance.   Neck:  a healed scar is present.  i do not appreciate a nodule in the thyroid or elsewhere in the neck    Impression & Recommendations:  Problem # 1:  POSTSURGICAL HYPOTHYROIDISM (ICD-244.0) needs increased rx  Problem # 2:  skin rash not due to synthroid  Medications Added to Medication List This Visit: 1)  Synthroid 200 Mcg Tabs (Levothyroxine sodium) .Marland Kitchen.. 1 once daily  Other Orders: Est. Patient Level III (91478)  Patient Instructions: 1)  resume levothyroxine 200 micrograms/day. 2)  return here 2 months. Prescriptions: SYNTHROID 200 MCG TABS (LEVOTHYROXINE SODIUM) 1 once daily  #30 x 11   Entered and Authorized by:   Minus Breeding MD   Signed by:   Minus Breeding MD on 11/05/2009   Method used:   [facility]        [facility]  [place] [address] (retail)       [address]       [place], [place]  [address]       Ph: [phone] or [phone]       Fax: [phone]   RxID:   [id number]

## 2010-05-08 NOTE — Assessment & Plan Note (Signed)
Summary: Pulmonary consultation/  pseudoasthma with MPN   Copy to:  [facility] Primary Provider/Referring Provider:  [name] ([facility])  CC:  Abnormal CXR.  History of Present Illness: 72  yowf quit smoking 2007 no resp comlaints then but  on ACE and using proventil and qvar seen June 10, 2009 for eval of MPN.  June 10, 2009 cc indolent onset variable, not progressive sob x 50 ft using neb proventil and qvar with minimal improvement x sev months assoc with nasal/ chest  congestion but  no sign excess mucus production or nocturnal episodes.  Pt denies any significant sore throat, dysphagia, itching, sneezing,   fever, chills, sweats, unintended wt loss, pleuritic or exertional cp, hempoptysis, change in activity tolerance  orthopnea pnd or leg swelling. Pt also denies any obvious fluctuation in symptoms with weather or environmental change or other alleviating or aggravating factors.       Current Medications (verified): 1)  Levothyroxine Sodium 200 Mcg Tabs (Levothyroxine Sodium) .Marland Kitchen.. 1 Qd 2)  Lisinopril 40 Mg Tabs (Lisinopril) .... Take 1 By Mouth Qd 3)  Furosemide 20 Mg Tabs (Furosemide) .... Take 2 By Mouth Qd 4)  Klor-Con 10 10 Meq Cr-Tabs (Potassium Chloride) .... Take 1 By Mouth Qd 5)  Proventil Hfa 108 (90 Base) Mcg/act Aers (Albuterol Sulfate) .... 2 Puffs Inhaled Every 4 Hours As Needed 6)  Qvar 80 Mcg/act Aers (Beclomethasone Dipropionate) .Marland Kitchen.. 1 Puff Two Times A Day 7)  Zolpidem Tartrate 5 Mg Tabs (Zolpidem Tartrate) .Marland Kitchen.. 1 At Bedtime As Needed Sleep 8)  Albuterol Sulfate (2.5 Mg/38ml) 0.083% Nebu (Albuterol Sulfate) .Marland Kitchen.. 1 Vial in Nebulizer Once Daily As Needed  Allergies (verified): 1)  ! Codeine  Past History:  Past Medical History: ADENOCARCINOMA, THYROID GLAND, PAPILLARY    - original surgery 2005    - recurrence Dec 2010 and s/p  I 131 Feb 18th 2011 POSTSURGICAL HYPOTHYROIDISM  Hypertension    - Ace D/c'd 06/10/2009 sinusitus,  chroinc asthma obesity  Family History: mom - 68y fibromyalgia, HTN s/p breast ca lumpectomy  dad - unknown health hx (no contract) mGM - CVA age 82s  no CAD  Emphysema- MGF  Asthma- Mother  Social History: married, lives with spouse, Michelle Piper employed with [employer] @ [employer] occasional alcohol former smoker - quit 2007.  smoked for 25 yrs up to 1 1/2 ppd  Review of Systems       The patient complains of shortness of breath with activity, headaches, and nasal congestion/difficulty breathing through nose.  The patient denies shortness of breath at rest, productive cough, non-productive cough, coughing up blood, chest pain, irregular heartbeats, acid heartburn, indigestion, loss of appetite, weight change, abdominal pain, difficulty swallowing, sore throat, tooth/dental problems, sneezing, itching, ear ache, anxiety, depression, hand/feet swelling, joint stiffness or pain, rash, change in color of mucus, and fever.    Vital Signs:  Patient profile:   50 year old female Weight:      303.25 pounds O2 Sat:      93 % on Room air Temp:     98.0 degrees F oral Pulse rate:   108 / minute BP sitting:   120 / 80  (right arm) Cuff size:   large  Vitals Entered By: Vernie Murders (June 10, 2009 11:20 AM)  O2 Flow:  Room air  Physical Exam  Additional Exam:  wt 304 > 303 June 10, 2009  amb wf with classic pseudowheeze resolves with purse  lip maneuver  HEENT: nl dentition, turbinates, and orophanx. Nl external ear canals without cough reflex NECK :  without JVD/Nodes/TM/ nl carotid upstrokes bilaterally LUNGS: no acc muscle use, clear to A and P bilaterally without cough on insp or exp maneuvers CV:  RRR  no s3 or murmur or increase in P2, no edema  ABD:  soft and nontender with nl excursion in the supine position. No bruits or organomegaly, bowel sounds nl MS:  warm without deformities, calf tenderness, cyanosis or clubbing SKIN: warm and dry without lesions    NEURO:  alert, approp, no deficits     Impression & Recommendations:  Problem # 1:  ASTHMA (ICD-493.90)   DDX of  difficult airways managment all start with A and  include Adherence, Ace Inhibitors, Acid Reflux, Active Sinus Disease, Alpha 1 Antitripsin deficiency, Anxiety masquerading as Airways dz,  ABPA,  allergy(esp in young), Aspiration (esp in elderly), Adverse effects of DPI,  Active smokers, plus one B  = Beta blocker use..   Suspect strongly this is Upper airway cough syndrome, so named because it's frequently impossible to sort out how much is LPR vs CR/sinusitis with freq throat clearing generating secondary extra esophageal GERD from wide swings in gastric pressure that occur with throat clearing, promoting self use of mint and menthol lozenges that reduce the lower esophageal sphincter tone and exacerbate the problem further.  These symptoms are easily confused with asthma/copd by even experienced pulmonogists because they overlap so much. These are the same pts who not infrequently have failed to tolerate ace inhibitors,  dry powder inhalers or biphosphonates or report having reflux symptoms that don't respond to standard doses of PPI  Problem # 2:  ABNORMAL CHEST XRAY (ICD-793.1) Needs w/u once we have her breathing better - ? met thyroid ca vs other primary.  Can't consider bx at this point.  Problem # 3:  HYPERTENSION (ICD-401.9)  The following medications were removed from the medication list:    Lisinopril 40 Mg Tabs (Lisinopril) .Marland Kitchen... Take 1 by mouth qd    Amlodipine Besylate 5 Mg Tabs (Amlodipine besylate) .Marland Kitchen... Take 1 by mouth qd Her updated medication list for this problem includes:    Furosemide 20 Mg Tabs (Furosemide) .Marland Kitchen... Take 2 by mouth qd    Benicar 40 Mg Tabs (Olmesartan medoxomil) ..... One tablet by mouth daily  ACE inhibitors are problematic in  pts with airway complaints because  even experienced pulmononlogists can't always distinguish ace effects from  copd/asthma.  By themselves they don't actually cause a problem, much like oxygen can't by itself start a fire, but they certainly serve as a powerful catalyst or enhancer for any "fire"  or inflammatory process in the upper airway, be it caused by an ET  tube or more commonly reflux (especially in the obese or pts with known GERD or who are on biphoshonates).  In the era of ARB near equivalency until we have a better handle on the reversibility of the airway problem, it just makes sense to avoid ace entirely in the short run and then decide later, having established a level of airway control using a reasonable limited regimen, whether to add back ace but even then being very careful to observe the pt for worsening airway control and number of meds used/ needed to control symptoms.    Medications Added to Medication List This Visit: 1)  Albuterol Sulfate (2.5 Mg/38ml) 0.083% Nebu (Albuterol sulfate) .Marland Kitchen.. 1 vial in nebulizer once daily as needed 2)  Benicar 40 Mg Tabs (Olmesartan medoxomil) .... One tablet by mouth daily  Other Orders: Consultation Level V (04540) Consultation Level V (98119)  Patient Instructions: 1)  Stop lisinopril 2)  Start Benicar 40 mg one daily, break in half if light headed standing 3)  Use inhalers only if needed for short of breaith cough or wheeze which should improve off lisinopril 4)  GERD (REFLUX)  is a common cause of respiratory symptoms. It commonly presents without heartburn and can be treated with medication, but also with lifestyle changes including avoidance of late meals, excessive alcohol, smoking cessation, and avoid fatty foods, chocolate, peppermint, colas, red wine, and acidic juices such as orange juice. NO MINT OR MENTHOL PRODUCTS SO NO COUGH DROPS  5)  USE SUGARLESS CANDY INSTEAD (jolley ranchers)  6)  NO OIL BASED VITAMINS  7)  Please schedule a follow-up appointment in 4-6 weeks, sooner if needed with PFT's and cxr on return

## 2010-07-03 ENCOUNTER — Emergency Department (HOSPITAL_COMMUNITY)
Admission: EM | Admit: 2010-07-03 | Discharge: 2010-07-03 | Disposition: A | Discharge status: Home / Self Care | Payer: 59 | Attending: Emergency Medicine | Admitting: Emergency Medicine

## 2010-07-03 ENCOUNTER — Emergency Department (HOSPITAL_COMMUNITY): Payer: 59

## 2010-07-03 DIAGNOSIS — I10 Essential (primary) hypertension: Secondary | ICD-10-CM | POA: Insufficient documentation

## 2010-07-03 DIAGNOSIS — Z85850 Personal history of malignant neoplasm of thyroid: Secondary | ICD-10-CM | POA: Insufficient documentation

## 2010-07-03 DIAGNOSIS — R51 Headache: Secondary | ICD-10-CM | POA: Insufficient documentation

## 2010-07-03 DIAGNOSIS — Z79899 Other long term (current) drug therapy: Secondary | ICD-10-CM | POA: Insufficient documentation

## 2010-07-03 DIAGNOSIS — R079 Chest pain, unspecified: Secondary | ICD-10-CM | POA: Insufficient documentation

## 2010-07-03 LAB — CBC
HCT: 41.5 % (ref 36.0–46.0)
Hemoglobin: 14.8 g/dL (ref 12.0–15.0)
MCHC: 35.7 g/dL (ref 30.0–36.0)
Platelets: 212 10*3/uL (ref 150–400)
WBC: 6.9 10*3/uL (ref 4.0–10.5)

## 2010-07-03 LAB — POCT CARDIAC MARKERS
CKMB, poc: <1 ng/mL — ABNORMAL LOW (ref 1.0–8.0)
Myoglobin, poc: 24 ng/mL (ref 12–200)
Troponin i, poc: <0.05 ng/mL (ref 0.00–0.09)

## 2010-07-03 LAB — DIFFERENTIAL
Lymphocytes Relative: 37 % (ref 12–46)
Lymphs Abs: 2.6 10*3/uL (ref 0.7–4.0)
Neutrophils Relative %: 50 % (ref 43–77)

## 2010-07-03 LAB — BASIC METABOLIC PANEL
Calcium: 7.2 mg/dL — ABNORMAL LOW (ref 8.4–10.5)
Chloride: 103 mEq/L (ref 96–112)
GFR calc Af Amer: >60 mL/min (ref >60)
GFR calc non Af Amer: >60 mL/min (ref >60)
Potassium: 3.4 mEq/L — ABNORMAL LOW (ref 3.5–5.1)

## 2010-07-08 LAB — URINALYSIS, ROUTINE W REFLEX MICROSCOPIC
Bilirubin Urine: NEGATIVE
Glucose, UA: NEGATIVE mg/dL
Hgb urine dipstick: NEGATIVE
Ketones, ur: NEGATIVE mg/dL
Nitrite: NEGATIVE
Protein, ur: NEGATIVE mg/dL
Specific Gravity, Urine: 1.025 (ref 1.005–1.030)
Urobilinogen, UA: 1 mg/dL (ref 0.0–1.0)
pH: 7.5 (ref 5.0–8.0)

## 2010-07-08 LAB — CBC
HCT: 39.9 % (ref 36.0–46.0)
Hemoglobin: 13.9 g/dL (ref 12.0–15.0)
MCHC: 34.8 g/dL (ref 30.0–36.0)
MCV: 95.3 fL (ref 78.0–100.0)
Platelets: 274 K/uL (ref 150–400)
RBC: 4.19 MIL/uL (ref 3.87–5.11)
RDW: 15.3 % (ref 11.5–15.5)
WBC: 11.6 10*3/uL — ABNORMAL HIGH (ref 4.0–10.5)

## 2010-07-08 LAB — BASIC METABOLIC PANEL WITH GFR
BUN: 13 mg/dL (ref 6–23)
CO2: 26 meq/L (ref 19–32)
Calcium: 7 mg/dL — ABNORMAL LOW (ref 8.4–10.5)
Chloride: 101 meq/L (ref 96–112)
GFR calc Af Amer: >60 mL/min (ref >60)
GFR calc non Af Amer: >60 mL/min (ref >60)
Glucose, Bld: 95 mg/dL (ref 70–99)
Potassium: 4.2 meq/L (ref 3.5–5.1)
Sodium: 135 meq/L (ref 135–145)

## 2010-07-08 LAB — BASIC METABOLIC PANEL: Creatinine, Ser: 0.87 mg/dL (ref 0.4–1.2)

## 2010-08-22 NOTE — Op Note (Signed)
   NAME:  Kathy Doyle, Kathy Doyle                     ACCOUNT NO.:  [id number]   MEDICAL RECORD NO.:  [id number]                   PATIENT TYPE:  AMB   LOCATION:  DAY                                  FACILITY:  [facility]   PHYSICIAN:  De Blanch, M.D.         DATE OF BIRTH:  [birth year]   DATE OF PROCEDURE:  05/30/2002  DATE OF DISCHARGE:                                 OPERATIVE REPORT   PREOPERATIVE DIAGNOSIS:  Carcinoma in situ of the vulva.   POSTOPERATIVE DIAGNOSIS:  Carcinoma in situ of the vulva.   PROCEDURE:  Extensive laser vaporization of the vulva.   ANESTHESIA:  LMA.   SURGEON:  Daniel L. Clarke-Pearson, M.D.   SURGICAL FINDINGS:  Evaluation under anesthesia was performed after  application of acetic acid.  There was a large area of hyperpigmented and a  white epithelium across the entire perineal body.  She had anal hemorrhoids  but no lesions on the anus or perianal region.  There was a separate  identifiable set up lesions on the anterior vulva at approximately 11  o'clock, measuring approximately 3 x 3 cm.   DESCRIPTION OF PROCEDURE:  The patient was brought to the operating room and  after satisfactory attainment of general anesthesia, was placed in lithotomy  position in candy-cane stirrups.  The perineum, and vagina were prepped with  Betadine.  The bladder was emptied with a straight catheter, and the patient  was draped with wet towels.  Acetic acid was applied and when removed, the  lesions were easily identified.  Using the CO2 laser at 20 watts of power,  the lesions were circumscribed in order to delineate the exterior portion  and the surgical margin which was least 10 mm beyond the obvious disease.  I  proceeded to laser away all affected skin down to the second surgical plane.  Both areas were lasered.  The char was wiped away with moist sponges to be  certain that we were at the proper depth.  Once this was accomplished,  Silvadene was applied, and  the patient was awakened from anesthesia and  taken to the recovery room in satisfactory condition.  Sponge, needle, and  instrument counts were correct x 2.                                               De Blanch, M.D.    DC/MEDQ  D:  05/30/2002  T:  05/30/2002  Job:  [id number]   cc:   Zenaida Niece, M.D.  [address]  [place]  [place] [address]  Fax: [phone]   Telford Nab, R.N.

## 2010-08-22 NOTE — Discharge Summary (Signed)
NAME:  MACKENZYE, MACKEL NO.:  [id number]   MEDICAL RECORD NO.:  [id number]          PATIENT TYPE:  INP   LOCATION:  5740                         FACILITY:  [facility]   PHYSICIAN:  Hermelinda Medicus, M.D.   DATE OF BIRTH:  06-10-1960   DATE OF ADMISSION:  04/25/2004  DATE OF DISCHARGE:  04/28/2004                                 DISCHARGE SUMMARY   This patient is a 50 year old female who has had a mass with bilateral neck  nodes and also a mass of her thyroid.  She was seen on CAT scan to have a  large posterior inferior neck node 2 x 3 cm in the posterior cervical  triangle which we felt was compatible with malignant lymphadenopathy,  primarily Hodgkin's.  However, this came back as a papillary carcinoma of  the thyroid.  The right lobe appeared to be larger than the left, however,  with the concept of the papillary thyroid carcinoma, a total thyroidectomy  would be carried out even though the nodes were more notably on the left and  the mass was more notably on the right.  I contacted Dr. Reather Littler and Dr.  Juleen China about I131 therapy which would be followed in the postoperative  course.   PAST MEDICAL HISTORY:  Unremarkable.  She did smoke but has discontinued  smoking.  She did smoke, at one time, half pack a day for 20 years.  She had  back surgery in 1992, right index finger surgery in 1998, sinus surgery in  2000. She has had some hypertensive problems in the past, blood pressure is  now 110/70.   MEDICATIONS:  1.  Lisinopril 20 in the a.m.  2.  Ortho 777 in the p.m.  3.  She has been on Augmentin recently for some mild sinus problems.   PHYSICAL EXAMINATION:  VITAL SIGNS:  Blood pressure 110/70, weight 261,  heart rate 68.  HEENT:  Ears are clear.  Oral cavities clear.  Nose and throat are clear.  NECK:  Free of any obvious thyromegaly but does have increased size thyroid.  The right side appears to be more notable but the left side shows  adenopathy.  CHEST:   Clear with no wheezing, rhonchi or rales.  CARDIOVASCULAR:  No mixed sounds, murmurs or gallops.  ABDOMEN:  Unremarkable.  She is mildly obese.  EXTREMITIES:  Unremarkable.   INITIAL DIAGNOSIS:  Papillary carcinoma of the thyroid with the initial mass  on the right but the left showing more adenopathy.   The patient underwent a total thyroidectomy with thyroid node dissection  with a left limited lymph node dissection with frozen sections and it was  found to be papillary carcinoma of the thyroid with extra thyroid extension  and lymph nodes that were found and removed showed metastatic papillary  carcinoma.  These areas were done in three different areas, left mid  jugular, left superior and left posterior triangle.  The right lobe on  pathology was found to be the papillary thyroid primary tumor.  Capsular  invasion was noted __________ extra thyroid extension, vascular lymphatic  extension.  Lymph nodes, six  out of six, were positive.  The patient did  very well postoperatively.   POSTOPERATIVE COURSE:  JP was placed at surgery and was removed.  She had 25  mL on the 21st, and on the 22nd she had 15 mL of serous fluid and this was  removed.  She was discharged on the 23rd.  The O2 saturation was 95, slight  neck edema, otherwise she was doing extremely well.  Her lab work was  essentially unremarkable.  Her evaluation during her intensive care  postoperative status was unremarkable.  She had a white count of 8.0,  hematocrit 40.2.  The electrolytes were within normal limits except a  glucose was 109, BUN was 3.   FINAL DIAGNOSIS:  Papillary carcinoma of the thyroid primarily in the right  but neck nodes primarily in the left.  Patient has done well postoperatively  and has done well in office follow-up and has completed her I131 treatment.   SECONDARY DIAGNOSES:  1.  History of hypertensive status.  2.  History of smoking one and a half packs for 20 years.  3.  Back surgery in  1992, lumbar.  4.  Right finger surgery.  5.  Sinus surgery.  6.  Mild obesity.      JC/MEDQ  D:  07/23/2004  T:  07/23/2004  Job:  [id number]   cc:   Zenaida Niece, M.D.  [address]  [place]  [place] [address]  Fax: [phone]   De Blanch, M.D.   Brooke Bonito, M.D.  [address]  [place]  [place] [address]  Fax: [phone]   Reather Littler, M.D.  [address]  [place]  [place] [address]  Fax: [phone]

## 2010-08-22 NOTE — Consult Note (Signed)
NAME:  Kathy Doyle, BECKOM                     ACCOUNT NO.:  [id number]   MEDICAL RECORD NO.:  [id number]                   PATIENT TYPE:  OUT   LOCATION:  GYN                                  FACILITY:  [facility]   PHYSICIAN:  John T. Kyla Balzarine, M.D.                 DATE OF BIRTH:  05-05-1960   DATE OF CONSULTATION:  DATE OF DISCHARGE:                                   CONSULTATION   CHIEF COMPLAINT:  This 50 year old nulligravid woman is seen at the request  of Zenaida Niece, M.D. for consultation regarding management of VIN.   HISTORY OF PRESENT ILLNESS:  The patient has noted pruritus and tenderness  between the rectum and vagina with lumpy skin over the past couple of  months.  She notes that she occasionally sees knots develop on the vulva  has a history of benign skin tag removed a year ago.  Zenaida Niece,  M.D. examined the patient revealing a condyloma at 11 o'clock and  condylomatous change across the posterior fourchette with hyperpigmentation  and white epithelium posteriorly.  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  The patient notes no history of HPV, STD, or significant  abnormal cervical cytology.   PAST MEDICAL HISTORY:  Significant for hypertension and mild asthma.  She  has a history of lumbar disk disease treated with surgery.  She has never  been pregnant.   MEDICATIONS:  1. Norvasc daily for hypertension.  2. Ortho-Novum 7/7/7 for heavy menses.   ALLERGIES:  CODEINE (nausea).   SOCIAL HISTORY:  The patient has a remote history of tobacco use.  Denies  ethanol.   FAMILY HISTORY:  Noncontributory.   PHYSICAL EXAMINATION:  VITAL SIGNS:  Weight 261 pounds, blood pressure  140/94, pulse 96, respirations 20.  GENERAL:  The patient is anxious, alert and oriented x3 in no acute  distress.  HEENT:  Benign with clear oropharynx.  LUNGS:  Fields are clear.  HEART:  Sounds are regular with no JVD.  BACK:  There is no back or CVA tenderness.  ABDOMEN:  Obese, soft, and benign without ascites, mass, organomegaly.  EXTREMITIES:  Full range of motion and strength without edema.  LYMPH:  Without lymphadenopathy.  GENITOURINARY:  External genitalia and BUS:  Normal to inspection and  palpation other than a 1 cm condylomatous lesion with peripheral warty  change in a linear distribution involving the interlabial fold at  approximately 11 o'clock.  A 3 cm patch of variably pigmented condylomatous  change extends across the posterior fourchette with healing biopsy site.  Anus and urethra are not involved.  Vagina:  Good support of bladder and  urethra.  A transverse vaginal septum is present extending throughout the  upper two-thirds of the vagina.  Two small, but normal appearing cervices  are present.  Cytology is obtained from each cervix.  Bimanual and  rectovaginal examinations are limited by patient habitus.  No tenderness or  mass palpable.   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   PLAN:  Pap smear from each cervix pending.  I had a long discussion with  patient regarding options for management including surgical resection or  laser vaporization of the vulva.  Risks and benefits of each procedure were  discussed and the patient is aware that there is approximately a 30% risk  value with either procedure.  We tentatively scheduled surgery for next week  and the patient wishes laser vaporization.  Multiple questions were answered  and the patient appears satisfied with this recommendation.                                               John T. Kyla Balzarine, M.D.    JTS/MEDQ  D:  05/23/2002  T:  05/23/2002  Job:  [id number]   cc:   Zenaida Niece, M.D.  [address]  [place]  [place] [address]  Fax: [phone]   Telford Nab, R.N.   [place]. Andrey Campanile, M.D.  [address]  [place]  [place] [address]  Fax: [phone]

## 2010-08-22 NOTE — Op Note (Signed)
NAME:  Kathy Doyle, Kathy Doyle           ACCOUNT NO.:  [id number]   MEDICAL RECORD NO.:  [id number]          PATIENT TYPE:  OIB   LOCATION:  NA                           FACILITY:  [facility]   PHYSICIAN:  Hermelinda Medicus, M.D.   DATE OF BIRTH:  03-Dec-1960   DATE OF PROCEDURE:  03/26/2004  DATE OF DISCHARGE:                                 OPERATIVE REPORT   PREOPERATIVE DIAGNOSIS:  Left posterior triangle cervical adenopathy, 2 x 3  cm, with multiple cervical nodes bilaterally.   POSTOPERATIVE DIAGNOSIS:  Left posterior triangle cervical adenopathy, 2 x 3  cm, with multiple cervical nodes bilaterally.   OPERATION:  Excisional biopsy of a left posterior triangle 2 x 3 cm lymph  node.   OPERATOR:  Hermelinda Medicus, M.D.   ANESTHESIA:  General endotracheal, Maren Beach, M.D.   PROCEDURE:  The patient was placed in the supine position.  Under general  endotracheal anesthesia, the patient was prepped and draped with her head  turned to the right and the shoulder elevated.  The patient was prepped with  Betadine and then the usual head and neck drape was used and finding a neck  crease, a marking pen was used to mark the incision and then a horizontal  incision following this neck crease was carried out through the skin down to  the sternocleidomastoid muscle at its posterior border.  Its posterior  border was immediately found after working through the platysma and the neck  fat, and this was pulled anteriorly and the neck node could be isolated.  The neck node was found to be extremely vascular in nature and was carefully  dissected free from the underlying and lateral tissues.  All hemostasis was  established with Bovie electrocoagulation and with 3-0 silk ties.  The  eleventh nerve was also carefully guarded so that was not damaged.  Once  this lesion, which was every bit of 2 x 3 cm on evaluation, was removed, it  was sent fresh to pathology as we felt it appeared to be a lymphoma-type  tissue.  Then all hemostasis was again checked and established using Bovie  electrocoagulation.  The area was irrigated.  Other nodes could be palpated  superiorly but were not removed, and the closure was begun after hemostasis  was again  checked very carefully and  Penrose drain was placed.  Closure was begun  using 2-0 chromic catgut and then 5-0 Ethilon.  The patient tolerated the  procedure well, was doing well postoperatively.  Follow-up will be in two  days, then in one week, then three weeks, and pathology will be found as  quickly as possible.       JC/MEDQ  D:  03/26/2004  T:  03/27/2004  Job:  [id number]   cc:   [facility]   Zenaida Niece, M.D.  [address]  [place]  [place] [address]  Fax: [phone]   De Blanch, M.D.

## 2010-08-22 NOTE — H&P (Signed)
NAME:  Kathy Doyle, KOHLER NO.:  [id number]   MEDICAL RECORD NO.:  [id number]          PATIENT TYPE:  OIB   LOCATION:  2550                         FACILITY:  [facility]   PHYSICIAN:  Hermelinda Medicus, M.D.   DATE OF BIRTH:  06/21/1960   DATE OF ADMISSION:  04/25/2004  DATE OF DISCHARGE:                                HISTORY & PHYSICAL   This patient is a 50 year old female who has been a one and a half pack a  day smoker for 20 years.  She quit in 2002.  She entered my office with  bilateral neck nodes where she had these neck nodes a considerable number of  weeks but the largest neck node on CAT scan was a posterior inferior node  measuring 2 x 3-cm in the posterior __________ triangle.  These nodes were  compatible with a malignant lymphadenopathy primarily Hodgkin's.  She had no  evidence of any problem in her thyroid.  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  We reviewed  this with the pathologist and with the radiologist and he still felt that he  saw nothing of any great note in the thyroid.  The right lobe appeared to be  slightly larger that the left.  She, however, now is returning for a total  thyroidectomy with a left limited neck dissection to remove the remaining  lymph nodes.  I have talked with Dr. Juleen China about this who plans to do I-  131therapy and also we have talked to Dr. Sherryll Burger who has reviewed the  pathology.  We also talked to Reather Littler, M.D.  The patient now enters for  this total thyroidectomy and a left node neck dissection.   PAST HISTORY:  1.  Quite unremarkable.  2.  She did smoke but has discontinued the smoking.  3.  She did have low back surgery in 1992.  4.  Had a right index finger injured in 1998.  5.  Has had sinus surgery in 2000.  6.  She has had some sinus problems at intermittent times.  She has been on      antibiotics fairly recently.  7.  She has had some hypertension problems  but her blood pressure right now      is 110/70.  She is well under control.  8.  She has also had a strep throat in the past but it has resolved very      nicely on the Augmentin.   MEDICATIONS:  1.  Lisinopril 20 in the morning.  2.  Ortho 777 in the p.m.  3.  She has been on Augmentin in the past.   REVIEW OF SYSTEMS:  She has dentures, upper and lower partial, and she is in  generally good health.   PHYSICAL EXAMINATION:  VITAL SIGNS:  Reveal a blood pressure of 110/70.  Her  height is 65 inches.  She weighs 261.  Heart rate is 68.  EARS:  Clear.  The tympanic membranes are clear.  ORAL CAVITY:  Clear.  NOSE:  Clear.  NECK:  Free of  any thyromegaly but she does have cervical adenopathy on both  sides, the left more noted than the right.  CHEST:  Clear.  No rales, rhonchi, or wheezes.  CARDIOVASCULAR:  __________  murmurs or gallops.  ABDOMEN:  Unremarkable.  She is mildly obese.  EXTREMITIES:  Unremarkable except the previous surgery noted.   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   SECONDARY DIAGNOSES:  1.  History of a mild hypertensive status.  2.  History of smoking, one and a half packs a day for 20 years, stopped in      2002.  3.  History of back surgery in 1992.  4.  History of right finger surgery in 1999.  5.  Sinus surgery in 2000.       JC/MEDQ  D:  04/25/2004  T:  04/25/2004  Job:  [id number]   cc:   Zenaida Niece, M.D.  [address]  [place]  [place] [address]  Fax: [phone]   [facility]   De Blanch, M.D.   Brooke Bonito, M.D.  [address]  [place]  [place] [address]  Fax: [phone]   Reather Littler, M.D.  [address]  [place]  [place] [address]  Fax: [phone]

## 2010-08-22 NOTE — Discharge Summary (Signed)
[facility]  Patient:    BRESHAY, ILG Visit Number: [id number] MRN: [id number]          Service Type: MED Location: 3W [id number] [id number] Attending Physician:  Drema Halon Dictated by:   Vale Haven Andrey Campanile, M.D. Admit Date:  01/03/2001 Discharge Date: 01/05/2001   CC:         Duffy Rhody C. Andrey Campanile, M.D.   Discharge Summary  PRESENTING HISTORY:  This 50 year old white female had been seen in the office on two separate occasions with nasal congestion, cough, and rhonchi in the chest. She was wheezing and placed on antibiotics and steroids. She failed to improve and was still having shortness of breath with any activity and was admitted for hydration and treatment of her asthma. Her blood pressure was also initially elevated.  Past medical history, review of systems, physical exam was dictated on the admission history and physical, is not returned, will not be repeated at this time.  HOSPITAL COURSE:  The importance of hydration and appropriate antibiotics cannot be overemphasized in the treatment of this case. She required an intravenous high dose Solu-Medrol and albuterol inhalations. She was placed at bed rest for 36 hours. She did take liquids well. Blood cultures and sputum were negative. Her general lab work was good and EKG showed no acute change. She felt much better after 36 hours of IV steroids and antibiotics. Smoking was discontinued. She was begun on Flovent 44 b.i.d. and educated in proper use of nebulizers. She was improved by the morning of discharge and was discharged to be followed as an outpatient.  DISCHARGE DIAGNOSES: 1. Asthma. 2. History of hypertension. 3. Lower respiratory infection.  DISCHARGE MEDICATIONS: 1. Norvasc 5 mg a day. 2. Flovent 44 two puffs b.i.d. 3. Albuterol inhaler for rescue. 4. Cipro 500 mg b.i.d. 5. Guaifenesin b.i.d. 600 mg with a glass of liquid. 6. Prednisone 60 mg a day for two days, 40  mg a day for three days,    20 mg a day for three days, 10 mg a day for two days, and off.  DISCHARGE FOLLOWUP:  The patient is to follow up in the office in one week. Work: None until first of the week.  COMPLICATIONS:  None.  CONDITION ON DISCHARGE:  Improved.  DIET:  She is instructed in a low-salt, low-carbohydrate diet. Dictated by:   Vale Haven Andrey Campanile, M.D. Attending Physician:  Drema Halon DD:  01/05/01 TD:  01/05/01 Job: [id number] [id number]

## 2010-08-22 NOTE — H&P (Signed)
[facility]  Patient:    Kathy Doyle, AUERBACH Visit Number: [id number] MRN: [id number]          Service Type: Attending:  Vale Haven. Andrey Campanile, M.D. Dictated by:   Vale Haven Andrey Campanile, M.D. Adm. Date:  01/03/01                           History and Physical  PRESENTING HISTORY:           The patient is a 50 year old white female who has been seen in the office twice over a two-week period with nasal congestion, subsequent cough and chest congestion with wheezing.  She was placed on decongestants, antibiotic in the form of Zithromax and prednisone. when it did not abate, she was recoursed on her prednisone and switched to Avelox.  She had shortness of breath with activity and was seen at the office with the above symptoms and increased shortness of breath.  She was sent to the emergency room and subsequently admitted.  In the emergency room, she had infiltration compatible with rhonchi.  There is no definite pneumonia.  Her saturations were 92% to 94%, although a blood gas looked good at rest after a bronchodilation treatment.  Her blood pressure was slightly elevated at 160/97 initially, and she was admitted to be hydrated and treated with IV steroids, antibiotics and other measures.  ALLERGIES:                    Codeine nauseates.  MEDICATIONS:                  1. Norvasc 10 mg q.d. for hypertension.                               2. Birth control pills.  SOCIAL HISTORY:               She has smoked in the past.  No significant alcohol.  She is married and gainfully employed.  HOSPITALIZATIONS AND OPERATIONS:               1. Cat bite with infection.                               2. Lumbar disk surgery x 1.  REVIEW OF SYSTEMS:            The pertinent positives are two in this patient. 1) She has high blood pressure, which she has controlled well with medication when she takes it properly.  2) The patient has had bronchitis in the past and has  occasionally wheezes with infection.  However, it is becoming clear that this is recurring and the emphasis on not smoking has already been made along with further therapies which will be started including a preventative inhaler and a rescue inhaler.  PHYSICAL EXAMINATION:  VITAL SIGNS:                  Temperature 98, pulse 100, respirations 22 with mild to moderate dyspnea.  SKIN:                         Cool and dry with good turgor.  No acute rash.  HEENT:  Dry mucous membranes of the mouth.  There is 2+ nasal congestion.  CARDIORESPIRATORY:            Expiratory wheezing with prolongation of expiratory phase. Heart with no murmurs.  PMI is normal.  ABDOMEN:                      Soft.  Obese.  Nontender without organomegaly or masses.  GENITOURINARY AND RECTAL:     Deferred.  NEUROMUSCULAR AND SKELETAL:   Alert, oriented, conversant and normal.  IMPRESSION:                   1. Asthma.                               2. Bronchitis.                               3. History of hypertension.                               4. Status post lumbar disk disease.  PLAN:                         Admit.  Hydrate.  IV steroids.  Oral preventive steroids in an inhaled form.  Albuterol inhalation therapy.  Oxygen p.r.n. Dictated by:   Vale Haven Andrey Campanile, M.D. Attending:  Vale Haven. Andrey Campanile, M.D. DD:  01/03/01 TD:  01/03/01 Job: [id number]

## 2010-08-22 NOTE — Op Note (Signed)
NAME:  Kathy Doyle, Kathy Doyle NO.:  [id number]   MEDICAL RECORD NO.:  [id number]          PATIENT TYPE:  OIB   LOCATION:  2115                         FACILITY:  [facility]   PHYSICIAN:  Hermelinda Medicus, M.D.   DATE OF BIRTH:  1960/10/22   DATE OF PROCEDURE:  04/25/2004  DATE OF DISCHARGE:                                 OPERATIVE REPORT   PREOPERATIVE DIAGNOSIS:  Papillary carcinoma of the thyroid with obvious  lymph nodes in the left neck that appears to be involving both lobes of the  thyroid.   POSTOPERATIVE DIAGNOSIS:  Papillary carcinoma of the thyroid with obvious  lymph nodes in the left neck that appears to be involving both lobes of the  thyroid.   PROCEDURE:  Total thyroidectomy with a left lymph node limited dissection  with frozen sections.   SURGEON:  Hermelinda Medicus, M.D.   ANESTHESIA:  General endotracheal anesthesia by Dr. Sampson Goon.   ASSISTANT:  Kristine Garbe. Ezzard Standing, M.D.   DESCRIPTION OF PROCEDURE:  The patient was placed in the supine position  under general endotracheal anesthesia.  The shoulder drape was placed and  the patient was draped in such a way as to do added neck incisions if  necessary and the patient was draped with Betadine in the usual neck drape.  A thyroid incision was carried out and carried down through the skin,  platysma and the superficial fat getting down to the strap muscles.  The  skin was then reflected anteriorly and posteriorly and the thyroid retractor  was placed.  The strap muscles were separated at the midline and reflected  laterally.  The thyroid on both sides was found to be reasonably small, but  the right side was much larger than the left and there were several areas of  concern in this and we saved the parathyroid glands but we sent two frozen  sections from the right side and both were positive for papillary cancer. We  then dissected the thyroid gland totally removing this and all hemostasis  was  established with Bovie electrocoagulation and ties with 2-0 silk.  Once  this was completed the Berry's ligament was used as a landmark in guarding  the recurrent nerve and the superior and inferior thyroid artery and vein  were located, clamped, cross cut and tied.  The left side was then  approached which was a slightly smaller gland and having severed the isthmus  we reflected the gland laterally, worked down toward the tracheal esophageal  groove, worked inferiorly, cross cutting and tying the inferior and superior  arteries and vein of the thyroid and then guarding again, approaching  Berry's ligament, we guarded the recurrent nerve very carefully.  We found  more tumor and what appeared to be tumor in the superior aspect of the  thyroid on the left and we were concerned about the inferior aspect and we  also guarded the parathyroid glands on the left.  This thyroid lobe was then  removed and sent for frozen section and this superior concerning area was  found to be papillary carcinoma.  We then worked lateral to the  strap  muscles beneath the sternocleidomastoid, guarded the carotid very carefully,  isolated the jugular vein from these very firm hemorrhagic type lymph nodes  which where a previous biopsy was done showed the papillary tumor and these  nodes were dissected from the jugular vein.  Cross small exit vessels from  the jugular vein were cross clamped and tied with 5-0 and 4-0 silk.  A  Surgicel was placed in this area in this pocket as the lymph nodes were  quite hemorrhagic as we removed these. All hemostasis was established with  Bovie electrocoagulation and 2-0 silk ties and the bipolar cautery.  The  jugular and the carotid were identified, but the vagus nerve was not and the  eleventh nerve was also identified.  Once this was completed and all this  area was dry, we looked over in the right side near the jugular vein, but  did not find any nodes that we felt were  something that we could resect.  There were none that were palpable and therefore we did not do any further  dissection on that side. All hemostasis was checked again around the thyroid  lobes themselves and some Surgicel was placed in the left side as this was  the most difficult area because of the tumor process.  We did have an area  that came very close to the recurrent nerve which was tumor trying to get  this and trying to get around and not damage the recurrent nerve was  somewhat difficult, but we thought that we were able to accomplish this.  A  #7 Jackson-Pratt was then placed in such a way as to drain the thyroid area  and also the left neck limited dissection area and this was exited through  the skin on the left side.  This was sutured in place with 2-0 silk.  Closure was with 2-0 chromic catgut in two layers and then the skin was  closed with 5-0 Ethilon.  The strap muscle were also brought into  approximation using 2-0 chromic.  The Jackson-Pratt was then connected and  minimal drainage was noted, approximately 2 to 5 mL.  With this bilateral  surgery as we awakened the patient, we looked at her larynx and both vocal  cords were moving well as we extubated the patient.  The patient tolerated  the procedure very well and is doing well postoperatively.  She will be kept  in a step down intensive care environment for at least one day and then will  be followed and kept in the hospital for at least probably three to four  days.  Follow-up will be then in one week, two weeks, three weeks and then  as the endocrinologist come about we will be doing an I-131 therapy for this  tumor.       JC/MEDQ  D:  04/25/2004  T:  04/25/2004  Job:  [id number]   cc:   Kristine Garbe. Ezzard Standing, M.D.  [address]  [place] [address]  Fax: [phone]   Zenaida Niece, M.D.  [address]  [place]  [place] [address]  Fax: [phone]   Marjory Lies, M.D. [address]   [place]  [place] [address]  Fax: [phone]   De Blanch, M.D.   Reather Littler, M.D.  [address]  [place]  [place] [address]  Fax: [phone]   Brooke Bonito, M.D.  [address]  [place]  [place]  [address]  Fax: [phone]

## 2010-08-22 NOTE — H&P (Signed)
NAME:  Kathy Doyle, Kathy Doyle           ACCOUNT NO.:  [id number]   MEDICAL RECORD NO.:  [id number]          PATIENT TYPE:  OIB   LOCATION:  NA                           FACILITY:  [facility]   PHYSICIAN:  Hermelinda Medicus, M.D.   DATE OF BIRTH:  Jan 23, 1961   DATE OF ADMISSION:  DATE OF DISCHARGE:                                HISTORY & PHYSICAL   HISTORY OF PRESENT ILLNESS:  This patient is a 50 year old female who has  been a smoker of 1-1/2 packs a day for 20 years, but quit in '02, has had a  history of neck nodes and entered my office approximately a week and a half  ago with multiple neck nodes on both sides.  She was placed on antibiotics  immediately.  A CAT scan was obtained of her neck which showed the largest  node to be 2 x 3 cm in the posterior left triangle and then several nodes on  both sides compatible with a malignant lymphadenopathy.  The patient has no  x-ray evidence of nasopharyngeal, oropharyngeal or laryngeal abnormalities,  mild mucosal thickening the left maxillary sinus.  The imaged portions of  the long apices are also clear.  The chest x-ray has been clear.  We are  concerned about lymphoma and she now enters for a biopsy excision of the  largest node on the left posterior triangle region.   PAST HISTORY:   MEDICATIONS:  Her past history is that of being on:  1.  Lisinopril 20 mg in the a.m.  2.  Ortho 7/7/7 in the p.m.  3.  Augmentin twice daily since March 24, 2004.   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   MEDICAL ILLNESSES:  She has had some hypertension problems, but her blood  pressure is 110/70.  She has had strep throat in the past and has been on  and off antibiotics this year.   REVIEW OF SYSTEMS:  She is in generally good health.  She has dentures,  upper and lower partials.   PHYSICAL  EXAMINATION:  GENERAL:  Her physical examination reveals a well-  nourished obese lady of 261 pounds, height 65 inches, blood pressure 110/70,  respirations 18, heart rate 68.  HEENT:  Her ears are clear.  The oral cavity is clear.  Her larynx is clear.  True cords, false cords, epiglottis, base of tongue are clear of any  ulceration or mass.  The oral cavity is clear of ulceration or mass.  NECK:  No cervical adenopathy in the superior neck, but in the inferior neck  on both side, cervical adenopathy is noted.  On the left side, it is more  notable and is noted especially in the posterior triangle, as is shown on  the CAT scan, on the left side.  There is no tenderness.  She is  unresponsive to antibiotics.  There is no thyromegaly.  CHEST:  The chest is clear.  Increased AP diameter, slightly decreased  breath sounds, but no rales, rhonchi or wheezes.  The chest x-ray was clear.  CARDIOVASCULAR:  No opening snaps, murmurs or gallops.  ABDOMEN:  Abdomen is obese.  EXTREMITIES:  The extremities are unremarkable; previous surgery is noted.   INITIAL DIAGNOSES:  1.  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  2.  History of carcinoma in situ of vulva.  3.  History of right index finger and lower back surgery in 1998 and 1992.  4.  Sinus surgery in 2000.   PLAN:  Our plan is for a left posterior triangle lymph node biopsy excision  to be sent fresh to the pathology.       JC/MEDQ  D:  03/26/2004  T:  03/26/2004  Job:  [id number]   cc:   [facility]   Zenaida Niece, M.D.  [address]  [place]  [place] [address]  Fax: [phone]   De Blanch, M.D.

## 2010-08-22 NOTE — Consult Note (Signed)
   NAME:  Kathy Doyle, Kathy Doyle                     ACCOUNT NO.:  [id number]   MEDICAL RECORD NO.:  [id number]                   PATIENT TYPE:  OUT   LOCATION:  GYN                                  FACILITY[facility]  [facility]   PHYSICIAN:  John T. Kyla Balzarine, M.D.                 DATE OF BIRTH:  30-May-1960   DATE OF CONSULTATION:  DATE OF DISCHARGE:                                   CONSULTATION   REASON FOR CONSULTATION:  Ms. Fujii returns for postoperative follow-up  after undergoing laser vaporization of the vulva on May 30, 2002. She  continues to have a discharge but minimal pain except when voiding.   PHYSICAL EXAMINATION:  VITAL SIGNS: Stable and afebrile with blood pressure  of 140/80. Weight 258.5 pounds.  ABDOMEN: There is no groin adenopathy.  GU: Inspection of the operative site reveals approximately 50%  epithelialization of the operative sites. There are no other lesions. There  is modest tenderness to palpation.   ASSESSMENT:  Multi-focal VIN treated with laser vaporization.   PLAN:  The patient is reassured about the appearance of her vulva at this  time. We reinforced hygiene and wound care. The patient will return for  final postoperative visit in approximately 4-6 weeks and if she is healed  completely, can be followed at six month intervals by Dr. Jackelyn Knife.                                               John T. Kyla Balzarine, M.D.    JTS/MEDQ  D:  06/21/2002  T:  06/21/2002  Job:  [id number]   cc:   Zenaida Niece, M.D.  [address]  [place]  [place] [address]  Fax: [phone]   Telford Nab, R.N.

## 2010-09-10 ENCOUNTER — Other Ambulatory Visit: Payer: Self-pay | Admitting: Nurse Practitioner

## 2010-09-10 DIAGNOSIS — N939 Abnormal uterine and vaginal bleeding, unspecified: Secondary | ICD-10-CM

## 2010-09-22 ENCOUNTER — Other Ambulatory Visit: Payer: 59

## 2011-09-23 ENCOUNTER — Other Ambulatory Visit: Payer: Self-pay | Admitting: Obstetrics and Gynecology

## 2011-09-23 DIAGNOSIS — D219 Benign neoplasm of connective and other soft tissue, unspecified: Secondary | ICD-10-CM

## 2011-09-26 ENCOUNTER — Ambulatory Visit
Admission: RE | Admit: 2011-09-26 | Discharge: 2011-09-26 | Disposition: A | Discharge status: Home / Self Care | Payer: 59 | Source: Ambulatory Visit | Attending: Obstetrics and Gynecology | Admitting: Obstetrics and Gynecology

## 2011-09-26 DIAGNOSIS — D219 Benign neoplasm of connective and other soft tissue, unspecified: Secondary | ICD-10-CM

## 2011-09-26 MED ORDER — GADOBENATE DIMEGLUMINE 529 MG/ML IV SOLN
20.0000 mL | Freq: Once | INTRAVENOUS | Status: AC | PRN
  Administered 2011-09-26: 20 mL via INTRAVENOUS

## 2011-10-05 ENCOUNTER — Encounter (HOSPITAL_COMMUNITY): Payer: Self-pay | Admitting: Pharmacist

## 2011-10-09 ENCOUNTER — Other Ambulatory Visit: Payer: Self-pay | Admitting: Obstetrics and Gynecology

## 2011-10-19 ENCOUNTER — Encounter (HOSPITAL_COMMUNITY): Payer: Self-pay

## 2011-10-19 ENCOUNTER — Encounter (HOSPITAL_COMMUNITY)
Admission: RE | Admit: 2011-10-19 | Discharge: 2011-10-19 | Disposition: A | Discharge status: Home / Self Care | Payer: 59 | Source: Ambulatory Visit | Attending: Obstetrics and Gynecology | Admitting: Obstetrics and Gynecology

## 2011-10-19 HISTORY — DX: Unspecified asthma, uncomplicated: J45.909

## 2011-10-19 HISTORY — DX: Malignant (primary) neoplasm, unspecified: C80.1

## 2011-10-19 HISTORY — DX: Hypothyroidism, unspecified: E03.9

## 2011-10-19 HISTORY — DX: Essential (primary) hypertension: I10

## 2011-10-19 LAB — COMPREHENSIVE METABOLIC PANEL
AST: 13 U/L (ref 0–37)
Alkaline Phosphatase: 71 U/L (ref 39–117)
BUN: 8 mg/dL (ref 6–23)
CO2: 30 mEq/L (ref 19–32)
Chloride: 98 mEq/L (ref 96–112)
Creatinine, Ser: 0.62 mg/dL (ref 0.50–1.10)
GFR calc non Af Amer: >90 mL/min (ref >90)
Potassium: 3.2 mEq/L — ABNORMAL LOW (ref 3.5–5.1)
Total Bilirubin: 0.3 mg/dL (ref 0.3–1.2)

## 2011-10-19 LAB — CBC
HCT: 43 % (ref 36.0–46.0)
MCV: 103.9 fL — ABNORMAL HIGH (ref 78.0–100.0)
Platelets: 258 10*3/uL (ref 150–400)
RBC: 4.14 MIL/uL (ref 3.87–5.11)
WBC: 10.4 10*3/uL (ref 4.0–10.5)

## 2011-10-19 NOTE — Pre-Procedure Instructions (Signed)
Medical history and EKG of 3/12 reviewed and accepted by Cristela Blue. MD. No orders given.

## 2011-10-19 NOTE — Patient Instructions (Addendum)
20 Kathy Doyle  10/19/2011   Your procedure is scheduled on:  10/23/11  Enter through the Main Entrance of [facility] at 6 AM.  Pick up the phone at the desk and dial [phone].   Call this number if you have problems the morning of surgery: [phone]   Remember:   Do not eat food:After Midnight.  Do not drink clear liquids: After Midnight.  Take these medicines the morning of surgery with A SIP OF WATER: blood pressure medication and thyroid medication, bring inhaler day of surgery   Do not wear jewelry, make-up or nail polish.  Do not wear lotions, powders, or perfumes. You may wear deodorant.  Do not shave 48 hours prior to surgery.  Do not bring valuables to the hospital.  Contacts, dentures or bridgework may not be worn into surgery.  Leave suitcase in the car. After surgery it may be brought to your room.  For patients admitted to the hospital, checkout time is 11:00 AM the day of discharge.   Patients discharged the day of surgery will not be allowed to drive home.  Name and phone number of your driver: husband, Kathy Doyle  Special Instructions: CHG Shower Use Special Wash: 1/2 bottle night before surgery and 1/2 bottle morning of surgery.   Please read over the following fact sheets that you were given: Surgical Site Infection Prevention

## 2011-10-22 NOTE — H&P (Signed)
Kathy Doyle is an 51 y.o. 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 Patient c/o heavy irregular bleeding. U/S C/W multiple fibroids. MRI C/W two endometrial canals, two cervices, fibroids, and vaginal septum. Sonohysterogram was attempted in office and neither cervix could be cannulated.  Pertinent Gynecological History: Menses: flow is excessive with use of many pads or tampons on heaviest days Bleeding: intermenstrual bleeding Contraception: none DES exposure: unknown Blood transfusions: none Sexually transmitted diseases: no past history Previous GYN Procedures: none  Last mammogram: normal Date: 2013 Last pap: normal Date: 2013 OB History: G0, P0   Menstrual History: Menarche age: unknown No LMP recorded.    Past Medical History  Diagnosis Date  . Hypertension   . Hypothyroidism   . Cancer     Thyroid  . Asthma     Past Surgical History  Procedure Date  . Total thyroidectomy 05' 06' '10    No family history on file.  Social History:  reports that she has quit smoking. She does not have any smokeless tobacco history on file. She reports that she drinks alcohol. She reports that she does not use illicit drugs.  Allergies:  Allergies  Allergen Reactions  . Codeine Nausea Only    No prescriptions prior to admission    Review of Systems  Constitutional: Negative for fever.    There were no vitals taken for this visit. Physical Exam  Constitutional: She appears well-developed and well-nourished.  Cardiovascular: Normal rate and regular rhythm.   Respiratory: Effort normal and breath sounds normal.  GI: There is no tenderness.  Uterus enlarged but exam is compromised  No results found for this or any previous visit (from the past 24 hour(s)).  No results found.  Assessment/Plan: 51 yo with uterine didelphys and menometorrhagia. Hysteroscopy, D&C discussed with patient, risks reviewed.  Sunil Hue II,Kambrey Hagger  E 10/22/2011, 9:27 PM

## 2011-10-23 ENCOUNTER — Ambulatory Visit (HOSPITAL_COMMUNITY): Payer: 59 | Admitting: Anesthesiology

## 2011-10-23 ENCOUNTER — Encounter (HOSPITAL_COMMUNITY)
Admission: RE | Disposition: A | Discharge status: Home / Self Care | Payer: Self-pay | Source: Ambulatory Visit | Attending: Obstetrics and Gynecology

## 2011-10-23 ENCOUNTER — Ambulatory Visit (HOSPITAL_COMMUNITY)
Admission: RE | Admit: 2011-10-23 | Discharge: 2011-10-23 | Disposition: A | Discharge status: Home / Self Care | Payer: 59 | Source: Ambulatory Visit | Attending: Obstetrics and Gynecology | Admitting: Obstetrics and Gynecology

## 2011-10-23 ENCOUNTER — Encounter (HOSPITAL_COMMUNITY): Payer: Self-pay | Admitting: Anesthesiology

## 2011-10-23 DIAGNOSIS — Z01812 Encounter for preprocedural laboratory examination: Secondary | ICD-10-CM | POA: Insufficient documentation

## 2011-10-23 DIAGNOSIS — Z01818 Encounter for other preprocedural examination: Secondary | ICD-10-CM | POA: Insufficient documentation

## 2011-10-23 DIAGNOSIS — N920 Excessive and frequent menstruation with regular cycle: Secondary | ICD-10-CM | POA: Insufficient documentation

## 2011-10-23 DIAGNOSIS — Q5128 Other doubling of uterus, other specified: Secondary | ICD-10-CM | POA: Insufficient documentation

## 2011-10-23 SURGERY — DILATATION & CURETTAGE/HYSTEROSCOPY WITH RESECTOCOPE
Anesthesia: General | Site: Uterus | Wound class: Clean Contaminated

## 2011-10-23 MED ORDER — ONDANSETRON HCL 4 MG/2ML IJ SOLN
INTRAMUSCULAR | Status: DC | PRN
  Administered 2011-10-23: 4 mg via INTRAVENOUS

## 2011-10-23 MED ORDER — PROPOFOL 10 MG/ML IV EMUL
INTRAVENOUS | Status: DC | PRN
  Administered 2011-10-23: 200 mg via INTRAVENOUS

## 2011-10-23 MED ORDER — KETOROLAC TROMETHAMINE 30 MG/ML IJ SOLN: 15.0000 mg | Freq: Once | INTRAMUSCULAR | Status: DC | PRN

## 2011-10-23 MED ORDER — ONDANSETRON HCL 4 MG/2ML IJ SOLN: 4.0000 mg | Freq: Once | INTRAMUSCULAR | Status: DC | PRN

## 2011-10-23 MED ORDER — LIDOCAINE HCL (CARDIAC) 20 MG/ML IV SOLN
INTRAVENOUS | Status: AC
  Filled 2011-10-23: qty 5

## 2011-10-23 MED ORDER — FENTANYL CITRATE 0.05 MG/ML IJ SOLN
INTRAMUSCULAR | Status: AC
  Filled 2011-10-23: qty 2

## 2011-10-23 MED ORDER — KETOROLAC TROMETHAMINE 60 MG/2ML IM SOLN
INTRAMUSCULAR | Status: AC
  Filled 2011-10-23: qty 2

## 2011-10-23 MED ORDER — FENTANYL CITRATE 0.05 MG/ML IJ SOLN
INTRAMUSCULAR | Status: DC | PRN
  Administered 2011-10-23: 100 ug via INTRAVENOUS

## 2011-10-23 MED ORDER — FENTANYL CITRATE 0.05 MG/ML IJ SOLN: 25.0000 ug | INTRAMUSCULAR | Status: DC | PRN

## 2011-10-23 MED ORDER — LACTATED RINGERS IV SOLN
INTRAVENOUS | Status: DC
  Administered 2011-10-23: 07:00:00 via INTRAVENOUS

## 2011-10-23 MED ORDER — MIDAZOLAM HCL 2 MG/2ML IJ SOLN
INTRAMUSCULAR | Status: AC
  Filled 2011-10-23: qty 2

## 2011-10-23 MED ORDER — BUPIVACAINE HCL (PF) 0.25 % IJ SOLN
INTRAMUSCULAR | Status: AC
  Filled 2011-10-23: qty 30

## 2011-10-23 MED ORDER — MIDAZOLAM HCL 5 MG/5ML IJ SOLN
INTRAMUSCULAR | Status: DC | PRN
  Administered 2011-10-23: 2 mg via INTRAVENOUS

## 2011-10-23 MED ORDER — KETOROLAC TROMETHAMINE 30 MG/ML IJ SOLN
INTRAMUSCULAR | Status: AC
  Filled 2011-10-23: qty 1

## 2011-10-23 MED ORDER — KETOROLAC TROMETHAMINE 30 MG/ML IJ SOLN
INTRAMUSCULAR | Status: DC | PRN
  Administered 2011-10-23: 30 mg via INTRAVENOUS

## 2011-10-23 MED ORDER — CEFAZOLIN SODIUM-DEXTROSE 2-3 GM-% IV SOLR
2.0000 g | INTRAVENOUS | Status: AC
  Administered 2011-10-23: 2 g via INTRAVENOUS

## 2011-10-23 MED ORDER — MEPERIDINE HCL 25 MG/ML IJ SOLN: 6.2500 mg | INTRAMUSCULAR | Status: DC | PRN

## 2011-10-23 MED ORDER — PROPOFOL 10 MG/ML IV EMUL
INTRAVENOUS | Status: AC
  Filled 2011-10-23: qty 40

## 2011-10-23 MED ORDER — LIDOCAINE HCL 1 % IJ SOLN
INTRAMUSCULAR | Status: DC | PRN
  Administered 2011-10-23: 15 mL

## 2011-10-23 MED ORDER — GLYCINE 1.5 % IR SOLN
Status: DC | PRN
  Administered 2011-10-23: 1

## 2011-10-23 MED ORDER — DEXAMETHASONE SODIUM PHOSPHATE 4 MG/ML IJ SOLN
INTRAMUSCULAR | Status: DC | PRN
  Administered 2011-10-23: 10 mg via INTRAVENOUS

## 2011-10-23 MED ORDER — LIDOCAINE HCL (CARDIAC) 20 MG/ML IV SOLN
INTRAVENOUS | Status: DC | PRN
  Administered 2011-10-23: 80 mg via INTRAVENOUS

## 2011-10-23 MED ORDER — DEXAMETHASONE SODIUM PHOSPHATE 10 MG/ML IJ SOLN
INTRAMUSCULAR | Status: AC
  Filled 2011-10-23: qty 1

## 2011-10-23 MED ORDER — ONDANSETRON HCL 4 MG/2ML IJ SOLN
INTRAMUSCULAR | Status: AC
  Filled 2011-10-23: qty 2

## 2011-10-23 SURGICAL SUPPLY — 12 items
CANISTER SUCTION 2500CC (MISCELLANEOUS) ×2 IMPLANT
CATH ROBINSON RED A/P 16FR (CATHETERS) ×2 IMPLANT
CLOTH BEACON ORANGE TIMEOUT ST (SAFETY) ×2 IMPLANT
CONTAINER PREFILL 10% NBF 60ML (FORM) ×4 IMPLANT
ELECT REM PT RETURN 9FT ADLT (ELECTROSURGICAL) ×2
ELECTRODE REM PT RTRN 9FT ADLT (ELECTROSURGICAL) ×1 IMPLANT
GLOVE BIO SURGEON STRL SZ8 (GLOVE) ×4 IMPLANT
GOWN PREVENTION PLUS LG XLONG (DISPOSABLE) ×2 IMPLANT
GOWN STRL REIN XL XLG (GOWN DISPOSABLE) ×2 IMPLANT
LOOP ANGLED CUTTING 22FR (CUTTING LOOP) IMPLANT
PACK HYSTEROSCOPY LF (CUSTOM PROCEDURE TRAY) ×2 IMPLANT
TOWEL OR 17X24 6PK STRL BLUE (TOWEL DISPOSABLE) ×4 IMPLANT

## 2011-10-23 NOTE — Transfer of Care (Signed)
Immediate Anesthesia Transfer of Care Note  Patient: Kathy Doyle  Procedure(s) Performed: Procedure(s) (LRB): DILATATION & CURETTAGE/HYSTEROSCOPY WITH RESECTOCOPE (N/A)  Patient Location: PACU  Anesthesia Type: General  Level of Consciousness: awake, alert  and oriented  Airway & Oxygen Therapy: Patient Spontanous Breathing and Patient connected to nasal cannula oxygen  Post-op Assessment: Report given to PACU RN and Post -op Vital signs reviewed and stable  Post vital signs: Reviewed and stable  Complications: No apparent anesthesia complications

## 2011-10-23 NOTE — Anesthesia Procedure Notes (Signed)
Procedure Name: LMA Insertion Date/Time: 10/23/2011 7:26 AM Performed by: Graciela Husbands Pre-anesthesia Checklist: Patient identified, Patient being monitored, Emergency Drugs available, Timeout performed and Suction available Oxygen Delivery Method: Circle system utilized Intubation Type: IV induction LMA: LMA inserted LMA Size: 4.0 Number of attempts: 1 Placement Confirmation: positive ETCO2 and breath sounds checked- equal and bilateral Tube secured with: Tape Dental Injury: Teeth and Oropharynx as per pre-operative assessment

## 2011-10-23 NOTE — Anesthesia Postprocedure Evaluation (Signed)
Anesthesia Post Note  Patient: Kathy Doyle  Procedure(s) Performed: Procedure(s) (LRB): DILATATION & CURETTAGE/HYSTEROSCOPY WITH RESECTOCOPE (N/A)  Anesthesia type: General  Patient location: PACU  Post pain: Pain level controlled  Post assessment: Post-op Vital signs reviewed  Last Vitals:  Filed Vitals:   10/23/11 0845  BP: 122/72  Pulse: 81  Temp: 36.5 C  Resp: 14    Post vital signs: Reviewed  Level of consciousness: sedated  Complications: No apparent anesthesia complications

## 2011-10-23 NOTE — Brief Op Note (Signed)
10/23/2011  8:11 AM  PATIENT:  Kathy Doyle  51 y.o. female  PRE-OPERATIVE DIAGNOSIS:  Menometrorraghia  POST-OPERATIVE DIAGNOSIS:  Menometrorraghia  PROCEDURE:  Procedure(s) (LRB): DILATATION & CURETTAGE/HYSTEROSCopy  SURGEON:  Surgeon(s) and Role:    * Leslie Andrea, MD - Primary  PHYSICIAN ASSISTANT:   ASSISTANTS: none   ANESTHESIA:   general  EBL:     BLOOD ADMINISTERED:none  DRAINS: none   LOCAL MEDICATIONS USED:  LIDOCAINE   SPECIMEN:  Source of Specimen:  EMC right, EMC left  DISPOSITION OF SPECIMEN:  PATHOLOGY  COUNTS:  YES  TOURNIQUET:  * No tourniquets in log *  DICTATION: .Other Dictation: Dictation Number [id number]  PLAN OF CARE: Discharge to home after PACU  PATIENT DISPOSITION:  PACU - hemodynamically stable.   Delay start of Pharmacological VTE agent (>24hrs) due to surgical blood loss or risk of bleeding: not applicable

## 2011-10-23 NOTE — Op Note (Signed)
NAMEMarland Kitchen  DENNI, [place] NO.:  [id number]  MEDICAL RECORD NO.:  [id number]  LOCATION:  [facility]                          FACILITY:  [facility]  PHYSICIAN:  Guy Sandifer. Henderson Cloud, M.D. DATE OF BIRTH:  May 30, 1960  DATE OF PROCEDURE:  10/23/2011 DATE OF DISCHARGE:                              OPERATIVE REPORT   PREOPERATIVE DIAGNOSIS:  Menometrorrhagia.  POSTOPERATIVE DIAGNOSES: 1. Menometrorrhagia 2. Uterine didelphys.  PROCEDURES:  Hysteroscopy, D and C on the right.  Hysteroscopy, D and C on the left.  SURGEON:  Guy Sandifer. Henderson Cloud, MD.  ANESTHESIA:  General.  ESTIMATED BLOOD LOSS:  Drops.  SPECIMENS: 1. Endometrial curettings, right 2. Endometrial curettings, left, both to Pathology.  I AND O:  Distending media, 55 mL deficit.  INDICATIONS AND CONSENT: This patient is a 51 year old, G0, P0, with uterine didelphys and heavy irregular menses.  Hysteroscopy D and C, with possible resectoscope has been discussed preoperatively.  Potential risks and complications have been reviewed preoperatively including but not limited to, infection, organ damage, bleeding requiring transfusion of blood products with HIV and hepatitis acquisition, DVT, PE, pneumonia, uterine perforation, laparotomy, laparoscopy with recurrent or persistent abnormal bleeding, and pelvic pain.  All questions have been answered and consent was signed on the chart.  FINDINGS:  The fallopian tube ostia on the right and the fallopian tube ostia on the left were both identified.  No abnormal endometrial masses were noted.  PROCEDURE IN DETAIL:  The patient was taken to the operating room where she was identified, placed in dorsal supine position.  General anesthesia was induced via endotracheal intubation.  She was placed in a dorsal lithotomy position.  Time-out was undertaken.  She was prepped on the vulva.  She was prepped vaginally on the right and the left side of the vaginal septum, straight  catheterized, and draped in sterile fashion.  The right cervix was identified and grasped with a single- tooth tenaculum.  Paracervical block was placed circumferentially with approximately 10 mL of plain lidocaine.  Similar procedure was carried out on the left cervix.  Returning to the right cervix, the cervix was gently and progressively dilated.  Diagnostic hysteroscope was placed in the endocervical canal and advanced under direct visualization using distending media.  The above findings were noted.  Hysteroscope was withdrawn.  Sharp curettage was carried out for a small amount of tissue.  Hysteroscope was then replaced and advanced and the cavity was cleaned.  The left cervix was grasped with single-tooth tenaculum. Cervix was gently and progressively dilated.  Diagnostic hysteroscope was placed in the endocervical canal and advanced under direct visualization with distending media.  No abnormal structures were noted. Sharp curettage was carried out for a small amount of tissue. Inspection afterward again revealed clean cavity.  Good hemostasis was noted.  All instruments were removed.  All counts were correct.  The patient was awakened and taken to the recovery room in stable condition.     Guy Sandifer Henderson Cloud, M.D.     JET/MEDQ  D:  10/23/2011  T:  10/23/2011  Job:  [id number]

## 2011-10-23 NOTE — Progress Notes (Signed)
Reviewed with patient procedure. Risks reviewed. All questions answered. No changes to H&P.

## 2011-10-23 NOTE — Anesthesia Preprocedure Evaluation (Signed)
Anesthesia Evaluation  Patient identified by MRN, date of birth, ID band Patient awake    Reviewed: Allergy & Precautions, H&P , NPO status , Patient's Chart, lab work & pertinent test results  Airway Mallampati: I TM Distance: >3 FB Neck ROM: full    Dental  (+) Teeth Intact, Upper Dentures and Lower Dentures   Pulmonary asthma ,  Will use inhaler prior to OR   Pulmonary exam normal       Cardiovascular hypertension, Pt. on medications     Neuro/Psych negative neurological ROS  negative psych ROS   GI/Hepatic negative GI ROS, Neg liver ROS,   Endo/Other  Morbid obesity  Renal/GU negative Renal ROS     Musculoskeletal negative musculoskeletal ROS (+)   Abdominal (+) + obese,   Peds negative pediatric ROS (+)  Hematology negative hematology ROS (+)   Anesthesia Other Findings   Reproductive/Obstetrics negative OB ROS                           Anesthesia Physical Anesthesia Plan  ASA: III  Anesthesia Plan: General   Post-op Pain Management:    Induction: Intravenous  Airway Management Planned: LMA  Additional Equipment:   Intra-op Plan:   Post-operative Plan:   Informed Consent: I have reviewed the patients History and Physical, chart, labs and discussed the procedure including the risks, benefits and alternatives for the proposed anesthesia with the patient or authorized representative who has indicated his/her understanding and acceptance.     Plan Discussed with: CRNA and Surgeon  Anesthesia Plan Comments:         Anesthesia Quick Evaluation

## 2014-12-12 ENCOUNTER — Encounter (HOSPITAL_COMMUNITY)
Admission: EM | Disposition: A | Discharge status: Home / Self Care | Payer: Self-pay | Source: Home / Self Care | Attending: Emergency Medicine

## 2014-12-12 ENCOUNTER — Emergency Department (HOSPITAL_COMMUNITY): Payer: 59

## 2014-12-12 ENCOUNTER — Ambulatory Visit (HOSPITAL_COMMUNITY): Admission: AD | Admit: 2014-12-12 | Payer: Self-pay | Source: Ambulatory Visit | Admitting: Interventional Cardiology

## 2014-12-12 ENCOUNTER — Emergency Department (HOSPITAL_BASED_OUTPATIENT_CLINIC_OR_DEPARTMENT_OTHER): Payer: 59

## 2014-12-12 ENCOUNTER — Encounter (HOSPITAL_COMMUNITY): Payer: Self-pay

## 2014-12-12 ENCOUNTER — Emergency Department (HOSPITAL_COMMUNITY)
Admission: EM | Admit: 2014-12-12 | Discharge: 2014-12-12 | Disposition: A | Discharge status: Home / Self Care | Payer: 59 | Attending: Emergency Medicine | Admitting: Emergency Medicine

## 2014-12-12 DIAGNOSIS — Z85850 Personal history of malignant neoplasm of thyroid: Secondary | ICD-10-CM | POA: Diagnosis not present

## 2014-12-12 DIAGNOSIS — Z720 Tobacco use: Secondary | ICD-10-CM | POA: Insufficient documentation

## 2014-12-12 DIAGNOSIS — Z79899 Other long term (current) drug therapy: Secondary | ICD-10-CM | POA: Insufficient documentation

## 2014-12-12 DIAGNOSIS — I359 Nonrheumatic aortic valve disorder, unspecified: Secondary | ICD-10-CM

## 2014-12-12 DIAGNOSIS — R42 Dizziness and giddiness: Secondary | ICD-10-CM | POA: Diagnosis not present

## 2014-12-12 DIAGNOSIS — E039 Hypothyroidism, unspecified: Secondary | ICD-10-CM | POA: Insufficient documentation

## 2014-12-12 DIAGNOSIS — J45909 Unspecified asthma, uncomplicated: Secondary | ICD-10-CM | POA: Diagnosis not present

## 2014-12-12 DIAGNOSIS — I10 Essential (primary) hypertension: Secondary | ICD-10-CM | POA: Diagnosis not present

## 2014-12-12 LAB — CBC
HEMATOCRIT: 44.2 % (ref 36.0–46.0)
HEMOGLOBIN: 15.2 g/dL — AB (ref 12.0–15.0)
MCH: 38.1 pg — ABNORMAL HIGH (ref 26.0–34.0)
MCHC: 34.4 g/dL (ref 30.0–36.0)
MCV: 110.8 fL — ABNORMAL HIGH (ref 78.0–100.0)
Platelets: 203 10*3/uL (ref 150–400)
RBC: 3.99 MIL/uL (ref 3.87–5.11)
RDW: 15.7 % — AB (ref 11.5–15.5)
WBC: 7.4 10*3/uL (ref 4.0–10.5)

## 2014-12-12 LAB — BASIC METABOLIC PANEL
ANION GAP: 11 (ref 5–15)
BUN: 10 mg/dL (ref 6–20)
CALCIUM: 8.7 mg/dL — AB (ref 8.9–10.3)
CO2: 24 mmol/L (ref 22–32)
Chloride: 103 mmol/L (ref 101–111)
Creatinine, Ser: 1.13 mg/dL — ABNORMAL HIGH (ref 0.44–1.00)
GFR, EST NON AFRICAN AMERICAN: 55 mL/min — AB (ref >60)
Glucose, Bld: 141 mg/dL — ABNORMAL HIGH (ref 65–99)
Potassium: 4 mmol/L (ref 3.5–5.1)
Sodium: 138 mmol/L (ref 135–145)

## 2014-12-12 LAB — I-STAT TROPONIN, ED: TROPONIN I, POC: 0.01 ng/mL (ref 0.00–0.08)

## 2014-12-12 SURGERY — LEFT HEART CATH AND CORONARY ANGIOGRAPHY

## 2014-12-12 MED ORDER — SODIUM CHLORIDE 0.9 % IV BOLUS (SEPSIS)
1000.0000 mL | Freq: Once | INTRAVENOUS | Status: AC
  Administered 2014-12-12: 1000 mL via INTRAVENOUS

## 2014-12-12 NOTE — Discharge Instructions (Signed)

## 2014-12-12 NOTE — ED Notes (Signed)
Cardiology at bedside.

## 2014-12-12 NOTE — Progress Notes (Signed)
*  PRELIMINARY RESULTS* Echocardiogram 2D Echocardiogram has been performed.  Leavy Cella 12/12/2014, 12:16 PM

## 2014-12-12 NOTE — ED Notes (Signed)
Pt returned from xray

## 2014-12-12 NOTE — ED Notes (Signed)
Called Vascular for stat echo

## 2014-12-12 NOTE — ED Notes (Signed)
[facility]- pt sent here by MD after pt had a near syncopal episode. Pt works at the office and BP bottomed out. EKG and echo preformed which MD thought showed a stemi. Pt asymptomatic on arrival, denies chest pain, vitals stable with EMS.

## 2014-12-12 NOTE — ED Provider Notes (Signed)
CSN: [id number]     Arrival date & time 12/12/14  1038 History   First MD Initiated Contact with Patient 12/12/14 1050     Chief Complaint  Patient presents with  . Code STEMI     (Consider location/radiation/quality/duration/timing/severity/associated sxs/prior Treatment) HPI Comments: Patient presented as code STEMI from [facility]. She works there and was feeling dizzy today. She had a pressure of 60 over palp there at the clinic. They did a stat echo and saw that she had possible aortic regurgitation and no wall motion abnormality. She was sent over for further care. She reports that her blood pressure has been high so she took 2 nifedipine last night and one nifedipine this morning to help her blood pressure. She did not take her losartan. Her blood pressure last night was 200/125.  Patient is a 54 y.o. female presenting with dizziness. The history is provided by the patient.  Dizziness Quality:  Lightheadedness Severity:  Mild Onset quality:  Gradual Timing:  Constant Progression:  Unchanged Chronicity:  New Context: not with loss of consciousness and not when standing up   Relieved by:  Nothing Worsened by:  Nothing Associated symptoms: no shortness of breath and no vomiting     Past Medical History  Diagnosis Date  . Hypertension   . Hypothyroidism   . Cancer     Thyroid  . Asthma    Past Surgical History  Procedure Laterality Date  . Total thyroidectomy  05' 06' '10   No family history on file. Social History  Substance Use Topics  . Smoking status: Current Every Day Smoker -- 0.25 packs/day  . Smokeless tobacco: Not on file  . Alcohol Use: Yes     Comment: occasionally   OB History    No data available     Review of Systems  Constitutional: Negative for fever.  Respiratory: Negative for cough and shortness of breath.   Gastrointestinal: Negative for vomiting and abdominal pain.  Neurological: Positive for dizziness.  All other systems  reviewed and are negative.     Allergies  Codeine  Home Medications   Prior to Admission medications   Medication Sig Start Date End Date Taking? Authorizing Provider  acetaminophen (TYLENOL) 500 MG tablet Take 500-1,000 mg by mouth every 6 (six) hours as needed. For pain    Historical Provider, MD  beclomethasone (QVAR) 80 MCG/ACT inhaler Inhale 1 puff into the lungs as needed. For shortness of breath    Historical Provider, MD  NIFEdipine (PROCARDIA-XL/ADALAT CC) 30 MG 24 hr tablet Take 30 mg by mouth daily.    Historical Provider, MD  pazopanib (VOTRIENT) 200 MG tablet Take 200 mg by mouth 3 (three) times daily. Take on an empty stomach.    Historical Provider, MD  thyroid (ARMOUR) 180 MG tablet Take 180 mg by mouth daily.    Historical Provider, MD   BP 129/67 mmHg  Pulse 73  Temp(Src) 98 F (36.7 C) (Oral)  Resp 16  SpO2 93% Physical Exam  Constitutional: She is oriented to person, place, and time. She appears well-developed and well-nourished. No distress.  HENT:  Head: Normocephalic and atraumatic.  Mouth/Throat: Oropharynx is clear and moist.  Eyes: EOM are normal. Pupils are equal, round, and reactive to light.  Neck: Normal range of motion. Neck supple.  Cardiovascular: Normal rate and regular rhythm.  Exam reveals no friction rub.   No murmur heard. Pulmonary/Chest: Effort normal and breath sounds normal. No respiratory distress. She has no wheezes.  She has no rales.  Abdominal: Soft. She exhibits no distension. There is no tenderness. There is no rebound.  Musculoskeletal: Normal range of motion. She exhibits no edema.  Neurological: She is alert and oriented to person, place, and time.  Skin: She is not diaphoretic.  Nursing note and vitals reviewed.   ED Course  Procedures (including critical care time) Labs Review Labs Reviewed  [place], ED    Imaging Review No results found. I have personally reviewed and  evaluated these images and lab results as part of my medical decision-making.   EKG Interpretation   Date/Time:  Wednesday December 12 2014 10:41:40 EDT Ventricular Rate:  79 PR Interval:  161 QRS Duration: 89 QT Interval:  398 QTC Calculation: 456 R Axis:   37 Text Interpretation:  Sinus rhythm Consider left atrial enlargement  Probable anteroseptal infarct, old Minimal ST depression, diffuse leads No  significant change since last tracing Confirmed by Mingo Amber  MD, [place]  [id number]) on 12/12/2014 10:56:05 AM                     *[facility]*            [address], [place] [address]              [phone]  ------------------------------------------------------------------- Transthoracic Echocardiography  Patient:  Madilynn, Montante MR #:    [id number] Study Date: 12/12/2014 Gender:   F Age:    9 Height:   162.6 cm Weight:   113.9 kg BSA:    2.33 m^2 Pt. Status: Room:  [place], MD SONOGRAPHER [facility] ATTENDING  Murlean Caller [id number] PERFORMING  [facility], Inpatient ORDERING   Evelina Bucy Gayla Doss  cc:  ------------------------------------------------------------------- LV EF: 60% -  65%  ------------------------------------------------------------------- Indications:   Aortic insufficiency 424.1.  ------------------------------------------------------------------- History:  PMH: Adenocarcinoma. Risk factors: Current tobacco use. Hypertension.  ------------------------------------------------------------------- Study Conclusions  - Left ventricle: The cavity size was normal. There was moderate concentric hypertrophy. Systolic function was normal. The estimated ejection fraction was in the range of 60% to 65%. Wall motion was normal; there were no  regional wall motion abnormalities. Doppler parameters are consistent with abnormal left ventricular relaxation (grade 1 diastolic dysfunction). There was no evidence of elevated ventricular filling pressure by Doppler parameters. - Aortic valve: Trileaflet; normal thickness leaflets. There was mild regurgitation. - Aortic root: The aortic root was normal in size. - Mitral valve: Structurally normal valve. There was no regurgitation. - Left atrium: The atrium was normal in size. - Right ventricle: Systolic function was normal. - Right atrium: The atrium was normal in size. - Tricuspid valve: There was no regurgitation. - Pulmonic valve: There was no regurgitation. - Pulmonary arteries: Systolic pressure couldn&'t be assessed. - Inferior vena cava: The vessel was normal in size. - Pericardium, extracardiac: There was no pericardial effusion.  Impressions:  - Normal LV size and systolic function. Moderate concentric LVH, abnormal relaxation with normal filling pressures. Aortic valve is poorly visualized. Aortic root is upper normal size measuring 38 mm, ascending aorta has normal caliber. There is mild aortic insufficiency.  Transthoracic echocardiography. M-mode, complete 2D, spectral Doppler, and color Doppler. Birthdate: Patient birthdate: 1961-03-19. Age: Patient is 54 yr old. Sex: Gender: female. BMI: 43.1 kg/m^2. Blood pressure:  130/62 Patient status: Inpatient. Study date: Study date: 12/12/2014. Study time: 11:29 AM. Location: Emergency department.  -------------------------------------------------------------------  ------------------------------------------------------------------- Left ventricle: The cavity size was normal. There was moderate concentric hypertrophy. Systolic function was normal. The estimated ejection fraction was in the range of 60% to 65%. Wall motion was normal; there were no regional wall motion  abnormalities. Doppler parameters are consistent with abnormal left ventricular relaxation (grade 1 diastolic dysfunction). There was no evidence of elevated ventricular filling pressure by Doppler parameters.  ------------------------------------------------------------------- Aortic valve:  Trileaflet; normal thickness leaflets. Mobility was not restricted. Doppler: Transvalvular velocity was within the normal range. There was no stenosis. There was mild regurgitation.  ------------------------------------------------------------------- Aorta: Aortic root: The aortic root was normal in size.  ------------------------------------------------------------------- Mitral valve:  Structurally normal valve.  Mobility was not restricted. Doppler: Transvalvular velocity was within the normal range. There was no evidence for stenosis. There was no regurgitation.  ------------------------------------------------------------------- Left atrium: The atrium was normal in size.  ------------------------------------------------------------------- Right ventricle: The cavity size was normal. Wall thickness was normal. Systolic function was normal.  ------------------------------------------------------------------- Pulmonic valve:  Structurally normal valve.  Cusp separation was normal. Doppler: Transvalvular velocity was within the normal range. There was no evidence for stenosis. There was no regurgitation.  ------------------------------------------------------------------- Tricuspid valve:  Structurally normal valve.  Doppler: Transvalvular velocity was within the normal range. There was no regurgitation.  ------------------------------------------------------------------- Pulmonary artery:  The main pulmonary artery was normal-sized. Systolic pressure couldn&'t be assessed.  ------------------------------------------------------------------- Right atrium: The  atrium was normal in size.  ------------------------------------------------------------------- Pericardium: There was no pericardial effusion.  ------------------------------------------------------------------- Systemic veins: Inferior vena cava: The vessel was normal in size.  ------------------------------------------------------------------- Measurements  Left ventricle              Value    Reference LV ID, ED, PLAX chordal         45  mm   43 - 52 LV ID, ES, PLAX chordal         28.3 mm   23 - 38 LV fx shortening, PLAX chordal      37  %   >=29 LV PW thickness, ED           15.2 mm   --------- IVS/LV PW ratio, ED           0.92     <=1.3 LV ejection fraction, 1-p A4C      62  %   --------- LV end-diastolic volume, 2-p       81  ml   --------- LV end-systolic volume, 2-p       33  ml   --------- LV ejection fraction, 2-p        59  %   --------- Stroke volume, 2-p            48  ml   --------- LV end-diastolic volume/bsa, 2-p     35  ml/m^2 --------- LV end-systolic volume/bsa, 2-p     14  ml/m^2 --------- Stroke volume/bsa, 2-p          20.6 ml/m^2 --------- LV e&', lateral              5.61 cm/s  --------- LV E/e&', lateral             9.89     --------- LV e&', medial              4.91 cm/s  --------- LV E/e&', medial  11.3     --------- LV e&', average              5.26 cm/s  --------- LV E/e&', average             10.55    ---------  Ventricular septum            Value    Reference IVS thickness, ED            14  mm   ---------  Aortic valve               Value    Reference Aortic regurg pressure half-time     689  ms    ---------  Aorta                  Value    Reference Aortic root ID, ED            36  mm   ---------  Left atrium               Value    Reference LA ID, A-P, ES              30  mm   --------- LA ID/bsa, A-P              1.29 cm/m^2 <=2.2 LA volume, S               37  ml   --------- LA volume/bsa, S             15.9 ml/m^2 --------- LA volume, ES, 1-p A4C          35  ml   --------- LA volume/bsa, ES, 1-p A4C        15  ml/m^2 --------- LA volume, ES, 1-p A2C          35  ml   --------- LA volume/bsa, ES, 1-p A2C        15  ml/m^2 ---------  Mitral valve               Value    Reference Mitral E-wave peak velocity       55.5 cm/s  --------- Mitral A-wave peak velocity       87  cm/s  --------- Mitral deceleration time     (H)   317  ms   150 - 230 Mitral E/A ratio, peak          0.6     ---------  Systemic veins              Value    Reference Estimated CVP              3   mm Hg ---------  Legend: (L) and (H) mark values outside specified reference range.  ------------------------------------------------------------------- Prepared and Electronically Authenticated by  Ena Dawley, M.D. 2016-09-07T12:48:27  MDM   Final diagnoses:  Dizziness    54 year old female hears a code STEMI. She is evaluated immediately by Dr. Pernell Dupre with cardiology who canceled her code STEMI. EKG here is normal. Will get stat echo because echo at [facility] was concerning for wall motion abnormality and aortic regurgitation. Here well appearing, normal exam. Her episode was likely due to extra BP medication. BP ok here. Will obtain labs, Echo. Dr. Tamala Julian feels patient can go home if workup is negative. I  agree.  Echo ok. Stable for discharge.    Evelina Bucy, MD 12/12/14  1706 

## 2014-12-12 NOTE — ED Notes (Signed)
ECHO at bedside.
